# Patient Record
Sex: Female | Born: 1945
Health system: Southern US, Community
[De-identification: ages and names within clinical notes are randomized; demographics above are authoritative.]

## PROBLEM LIST (undated history)

## (undated) DIAGNOSIS — R000 Tachycardia, unspecified: Secondary | ICD-10-CM

## (undated) DIAGNOSIS — E7800 Pure hypercholesterolemia, unspecified: Secondary | ICD-10-CM

## (undated) DIAGNOSIS — I10 Essential (primary) hypertension: Secondary | ICD-10-CM

## (undated) HISTORY — PX: TRIGGER FINGER RELEASE: SHX641

## (undated) HISTORY — PX: CATARACT EXTRACTION: SUR2

## (undated) HISTORY — PX: CHOLECYSTECTOMY: SHX55

## (undated) HISTORY — PX: APPENDECTOMY: SHX54

---

## 2005-02-20 ENCOUNTER — Other Ambulatory Visit: Admission: RE | Admit: 2005-02-20 | Discharge: 2005-02-20 | Payer: Self-pay | Admitting: Internal Medicine

## 2005-02-21 ENCOUNTER — Encounter: Admission: RE | Admit: 2005-02-21 | Discharge: 2005-02-21 | Payer: Self-pay | Admitting: Internal Medicine

## 2006-02-25 ENCOUNTER — Encounter: Admission: RE | Admit: 2006-02-25 | Discharge: 2006-02-25 | Payer: Self-pay | Admitting: Internal Medicine

## 2006-03-11 ENCOUNTER — Encounter: Admission: RE | Admit: 2006-03-11 | Discharge: 2006-03-11 | Payer: Self-pay | Admitting: Internal Medicine

## 2007-02-28 ENCOUNTER — Encounter: Admission: RE | Admit: 2007-02-28 | Discharge: 2007-02-28 | Payer: Self-pay | Admitting: Family Medicine

## 2007-05-21 ENCOUNTER — Other Ambulatory Visit: Admission: RE | Admit: 2007-05-21 | Discharge: 2007-05-21 | Payer: Self-pay | Admitting: Family Medicine

## 2008-04-06 ENCOUNTER — Encounter: Admission: RE | Admit: 2008-04-06 | Discharge: 2008-04-06 | Payer: Self-pay | Admitting: Family Medicine

## 2009-04-21 ENCOUNTER — Encounter: Admission: RE | Admit: 2009-04-21 | Discharge: 2009-04-21 | Payer: Self-pay | Admitting: Family Medicine

## 2010-04-25 ENCOUNTER — Encounter: Admission: RE | Admit: 2010-04-25 | Discharge: 2010-04-25 | Payer: Self-pay | Admitting: Family Medicine

## 2011-04-02 ENCOUNTER — Other Ambulatory Visit: Payer: Self-pay | Admitting: Family Medicine

## 2011-04-02 DIAGNOSIS — Z1231 Encounter for screening mammogram for malignant neoplasm of breast: Secondary | ICD-10-CM

## 2011-05-03 ENCOUNTER — Ambulatory Visit
Admission: RE | Admit: 2011-05-03 | Discharge: 2011-05-03 | Disposition: A | Discharge status: Home / Self Care | Payer: BC Managed Care – PPO | Source: Ambulatory Visit | Attending: Family Medicine | Admitting: Family Medicine

## 2011-05-03 DIAGNOSIS — Z1231 Encounter for screening mammogram for malignant neoplasm of breast: Secondary | ICD-10-CM

## 2012-04-03 ENCOUNTER — Other Ambulatory Visit: Payer: Self-pay | Admitting: Family Medicine

## 2012-04-03 DIAGNOSIS — Z1231 Encounter for screening mammogram for malignant neoplasm of breast: Secondary | ICD-10-CM

## 2012-05-05 ENCOUNTER — Ambulatory Visit: Payer: BC Managed Care – PPO

## 2012-05-20 ENCOUNTER — Other Ambulatory Visit: Payer: Self-pay | Admitting: Family Medicine

## 2012-05-20 ENCOUNTER — Ambulatory Visit
Admission: RE | Admit: 2012-05-20 | Discharge: 2012-05-20 | Disposition: A | Discharge status: Home / Self Care | Payer: BC Managed Care – PPO | Source: Ambulatory Visit | Attending: Family Medicine | Admitting: Family Medicine

## 2012-05-20 DIAGNOSIS — Z1231 Encounter for screening mammogram for malignant neoplasm of breast: Secondary | ICD-10-CM

## 2012-05-23 ENCOUNTER — Ambulatory Visit: Payer: BC Managed Care – PPO

## 2012-05-28 ENCOUNTER — Ambulatory Visit
Admission: RE | Admit: 2012-05-28 | Discharge: 2012-05-28 | Disposition: A | Discharge status: Home / Self Care | Payer: BC Managed Care – PPO | Source: Ambulatory Visit | Attending: Family Medicine | Admitting: Family Medicine

## 2012-05-28 DIAGNOSIS — Z1231 Encounter for screening mammogram for malignant neoplasm of breast: Secondary | ICD-10-CM

## 2013-04-28 ENCOUNTER — Other Ambulatory Visit: Payer: Self-pay

## 2013-04-28 DIAGNOSIS — Z1231 Encounter for screening mammogram for malignant neoplasm of breast: Secondary | ICD-10-CM

## 2013-05-29 ENCOUNTER — Ambulatory Visit: Payer: BC Managed Care – PPO

## 2013-06-05 ENCOUNTER — Ambulatory Visit
Admission: RE | Admit: 2013-06-05 | Discharge: 2013-06-05 | Disposition: A | Discharge status: Home / Self Care | Payer: Medicare HMO | Source: Ambulatory Visit

## 2013-06-05 DIAGNOSIS — Z1231 Encounter for screening mammogram for malignant neoplasm of breast: Secondary | ICD-10-CM

## 2013-12-14 ENCOUNTER — Other Ambulatory Visit (HOSPITAL_COMMUNITY): Payer: Self-pay | Admitting: Family Medicine

## 2013-12-14 ENCOUNTER — Ambulatory Visit (HOSPITAL_COMMUNITY)
Admission: RE | Admit: 2013-12-14 | Discharge: 2013-12-14 | Disposition: A | Discharge status: Home / Self Care | Payer: Medicare HMO | Source: Ambulatory Visit | Attending: Family Medicine | Admitting: Family Medicine

## 2013-12-14 DIAGNOSIS — M7989 Other specified soft tissue disorders: Secondary | ICD-10-CM

## 2013-12-14 DIAGNOSIS — M79604 Pain in right leg: Secondary | ICD-10-CM

## 2013-12-14 DIAGNOSIS — M79609 Pain in unspecified limb: Secondary | ICD-10-CM

## 2013-12-14 NOTE — Progress Notes (Signed)
VASCULAR LAB PRELIMINARY  PRELIMINARY  PRELIMINARY  PRELIMINARY  Right lower extremity venous Doppler completed.    Preliminary report:  There is no DVT or SVT noted in the right lower extremity.  Denys Salinger, RVT 12/14/2013, 3:00 PM

## 2014-05-03 ENCOUNTER — Other Ambulatory Visit: Payer: Self-pay

## 2014-05-03 DIAGNOSIS — Z1231 Encounter for screening mammogram for malignant neoplasm of breast: Secondary | ICD-10-CM

## 2014-06-07 ENCOUNTER — Ambulatory Visit: Payer: Medicare HMO

## 2014-06-08 ENCOUNTER — Ambulatory Visit
Admission: RE | Admit: 2014-06-08 | Discharge: 2014-06-08 | Disposition: A | Discharge status: Home / Self Care | Payer: Medicare HMO | Source: Ambulatory Visit

## 2014-06-08 DIAGNOSIS — Z1231 Encounter for screening mammogram for malignant neoplasm of breast: Secondary | ICD-10-CM

## 2014-12-14 DIAGNOSIS — R002 Palpitations: Secondary | ICD-10-CM | POA: Diagnosis not present

## 2014-12-14 DIAGNOSIS — E049 Nontoxic goiter, unspecified: Secondary | ICD-10-CM | POA: Diagnosis not present

## 2014-12-14 DIAGNOSIS — I10 Essential (primary) hypertension: Secondary | ICD-10-CM | POA: Diagnosis not present

## 2014-12-28 DIAGNOSIS — E663 Overweight: Secondary | ICD-10-CM | POA: Diagnosis not present

## 2014-12-28 DIAGNOSIS — R42 Dizziness and giddiness: Secondary | ICD-10-CM | POA: Diagnosis not present

## 2014-12-28 DIAGNOSIS — R002 Palpitations: Secondary | ICD-10-CM | POA: Diagnosis not present

## 2014-12-28 DIAGNOSIS — I10 Essential (primary) hypertension: Secondary | ICD-10-CM | POA: Diagnosis not present

## 2014-12-29 DIAGNOSIS — R42 Dizziness and giddiness: Secondary | ICD-10-CM | POA: Diagnosis not present

## 2014-12-29 DIAGNOSIS — I10 Essential (primary) hypertension: Secondary | ICD-10-CM | POA: Diagnosis not present

## 2014-12-29 DIAGNOSIS — E663 Overweight: Secondary | ICD-10-CM | POA: Diagnosis not present

## 2014-12-29 DIAGNOSIS — I471 Supraventricular tachycardia: Secondary | ICD-10-CM | POA: Diagnosis not present

## 2014-12-29 DIAGNOSIS — R002 Palpitations: Secondary | ICD-10-CM | POA: Diagnosis not present

## 2015-01-03 DIAGNOSIS — I471 Supraventricular tachycardia: Secondary | ICD-10-CM | POA: Diagnosis not present

## 2015-01-05 DIAGNOSIS — R002 Palpitations: Secondary | ICD-10-CM | POA: Diagnosis not present

## 2015-01-31 DIAGNOSIS — E663 Overweight: Secondary | ICD-10-CM | POA: Diagnosis not present

## 2015-01-31 DIAGNOSIS — I10 Essential (primary) hypertension: Secondary | ICD-10-CM | POA: Diagnosis not present

## 2015-01-31 DIAGNOSIS — I471 Supraventricular tachycardia: Secondary | ICD-10-CM | POA: Diagnosis not present

## 2015-01-31 DIAGNOSIS — R002 Palpitations: Secondary | ICD-10-CM | POA: Diagnosis not present

## 2015-01-31 DIAGNOSIS — R42 Dizziness and giddiness: Secondary | ICD-10-CM | POA: Diagnosis not present

## 2015-02-16 DIAGNOSIS — H524 Presbyopia: Secondary | ICD-10-CM | POA: Diagnosis not present

## 2015-02-16 DIAGNOSIS — H2513 Age-related nuclear cataract, bilateral: Secondary | ICD-10-CM | POA: Diagnosis not present

## 2015-02-16 DIAGNOSIS — H35033 Hypertensive retinopathy, bilateral: Secondary | ICD-10-CM | POA: Diagnosis not present

## 2015-03-22 DIAGNOSIS — I10 Essential (primary) hypertension: Secondary | ICD-10-CM | POA: Diagnosis not present

## 2015-03-22 DIAGNOSIS — E6609 Other obesity due to excess calories: Secondary | ICD-10-CM | POA: Diagnosis not present

## 2015-03-22 DIAGNOSIS — I471 Supraventricular tachycardia: Secondary | ICD-10-CM | POA: Diagnosis not present

## 2015-05-02 ENCOUNTER — Other Ambulatory Visit: Payer: Self-pay

## 2015-05-02 DIAGNOSIS — Z1231 Encounter for screening mammogram for malignant neoplasm of breast: Secondary | ICD-10-CM

## 2015-06-10 ENCOUNTER — Ambulatory Visit
Admission: RE | Admit: 2015-06-10 | Discharge: 2015-06-10 | Disposition: A | Discharge status: Home / Self Care | Payer: Commercial Managed Care - HMO | Source: Ambulatory Visit

## 2015-06-10 DIAGNOSIS — Z1231 Encounter for screening mammogram for malignant neoplasm of breast: Secondary | ICD-10-CM

## 2015-09-20 DIAGNOSIS — E049 Nontoxic goiter, unspecified: Secondary | ICD-10-CM | POA: Diagnosis not present

## 2015-09-20 DIAGNOSIS — M8580 Other specified disorders of bone density and structure, unspecified site: Secondary | ICD-10-CM | POA: Diagnosis not present

## 2015-09-20 DIAGNOSIS — I10 Essential (primary) hypertension: Secondary | ICD-10-CM | POA: Diagnosis not present

## 2015-09-20 DIAGNOSIS — R7309 Other abnormal glucose: Secondary | ICD-10-CM | POA: Diagnosis not present

## 2015-09-20 DIAGNOSIS — H9190 Unspecified hearing loss, unspecified ear: Secondary | ICD-10-CM | POA: Diagnosis not present

## 2015-09-20 DIAGNOSIS — E6609 Other obesity due to excess calories: Secondary | ICD-10-CM | POA: Diagnosis not present

## 2015-09-20 DIAGNOSIS — I471 Supraventricular tachycardia: Secondary | ICD-10-CM | POA: Diagnosis not present

## 2015-12-26 DIAGNOSIS — J069 Acute upper respiratory infection, unspecified: Secondary | ICD-10-CM | POA: Diagnosis not present

## 2015-12-26 DIAGNOSIS — J040 Acute laryngitis: Secondary | ICD-10-CM | POA: Diagnosis not present

## 2016-03-01 DIAGNOSIS — H04123 Dry eye syndrome of bilateral lacrimal glands: Secondary | ICD-10-CM | POA: Diagnosis not present

## 2016-03-01 DIAGNOSIS — H524 Presbyopia: Secondary | ICD-10-CM | POA: Diagnosis not present

## 2016-03-01 DIAGNOSIS — H35033 Hypertensive retinopathy, bilateral: Secondary | ICD-10-CM | POA: Diagnosis not present

## 2016-03-01 DIAGNOSIS — H2513 Age-related nuclear cataract, bilateral: Secondary | ICD-10-CM | POA: Diagnosis not present

## 2016-03-01 DIAGNOSIS — H5203 Hypermetropia, bilateral: Secondary | ICD-10-CM | POA: Diagnosis not present

## 2016-03-23 DIAGNOSIS — I10 Essential (primary) hypertension: Secondary | ICD-10-CM | POA: Diagnosis not present

## 2016-03-23 DIAGNOSIS — H9190 Unspecified hearing loss, unspecified ear: Secondary | ICD-10-CM | POA: Diagnosis not present

## 2016-03-23 DIAGNOSIS — R002 Palpitations: Secondary | ICD-10-CM | POA: Diagnosis not present

## 2016-03-23 DIAGNOSIS — I471 Supraventricular tachycardia: Secondary | ICD-10-CM | POA: Diagnosis not present

## 2016-03-23 DIAGNOSIS — M8580 Other specified disorders of bone density and structure, unspecified site: Secondary | ICD-10-CM | POA: Diagnosis not present

## 2016-03-23 DIAGNOSIS — E7800 Pure hypercholesterolemia, unspecified: Secondary | ICD-10-CM | POA: Diagnosis not present

## 2016-03-23 DIAGNOSIS — Z209 Contact with and (suspected) exposure to unspecified communicable disease: Secondary | ICD-10-CM | POA: Diagnosis not present

## 2016-03-23 DIAGNOSIS — E049 Nontoxic goiter, unspecified: Secondary | ICD-10-CM | POA: Diagnosis not present

## 2016-03-23 DIAGNOSIS — Z0000 Encounter for general adult medical examination without abnormal findings: Secondary | ICD-10-CM | POA: Diagnosis not present

## 2016-05-08 ENCOUNTER — Other Ambulatory Visit: Payer: Self-pay | Admitting: Family Medicine

## 2016-05-08 DIAGNOSIS — Z1231 Encounter for screening mammogram for malignant neoplasm of breast: Secondary | ICD-10-CM

## 2016-06-11 ENCOUNTER — Ambulatory Visit
Admission: RE | Admit: 2016-06-11 | Discharge: 2016-06-11 | Disposition: A | Discharge status: Home / Self Care | Payer: Commercial Managed Care - HMO | Source: Ambulatory Visit | Attending: Family Medicine | Admitting: Family Medicine

## 2016-06-11 DIAGNOSIS — Z1231 Encounter for screening mammogram for malignant neoplasm of breast: Secondary | ICD-10-CM

## 2016-09-24 DIAGNOSIS — Z6829 Body mass index (BMI) 29.0-29.9, adult: Secondary | ICD-10-CM | POA: Diagnosis not present

## 2016-09-24 DIAGNOSIS — E7800 Pure hypercholesterolemia, unspecified: Secondary | ICD-10-CM | POA: Diagnosis not present

## 2016-09-24 DIAGNOSIS — E663 Overweight: Secondary | ICD-10-CM | POA: Diagnosis not present

## 2016-09-24 DIAGNOSIS — I10 Essential (primary) hypertension: Secondary | ICD-10-CM | POA: Diagnosis not present

## 2017-03-07 DIAGNOSIS — H04123 Dry eye syndrome of bilateral lacrimal glands: Secondary | ICD-10-CM | POA: Diagnosis not present

## 2017-03-07 DIAGNOSIS — H2513 Age-related nuclear cataract, bilateral: Secondary | ICD-10-CM | POA: Diagnosis not present

## 2017-03-07 DIAGNOSIS — H35033 Hypertensive retinopathy, bilateral: Secondary | ICD-10-CM | POA: Diagnosis not present

## 2017-03-07 DIAGNOSIS — H524 Presbyopia: Secondary | ICD-10-CM | POA: Diagnosis not present

## 2017-03-07 DIAGNOSIS — H5203 Hypermetropia, bilateral: Secondary | ICD-10-CM | POA: Diagnosis not present

## 2017-03-26 DIAGNOSIS — I471 Supraventricular tachycardia: Secondary | ICD-10-CM | POA: Diagnosis not present

## 2017-03-26 DIAGNOSIS — I10 Essential (primary) hypertension: Secondary | ICD-10-CM | POA: Diagnosis not present

## 2017-03-26 DIAGNOSIS — H9190 Unspecified hearing loss, unspecified ear: Secondary | ICD-10-CM | POA: Diagnosis not present

## 2017-03-26 DIAGNOSIS — E663 Overweight: Secondary | ICD-10-CM | POA: Diagnosis not present

## 2017-03-26 DIAGNOSIS — E049 Nontoxic goiter, unspecified: Secondary | ICD-10-CM | POA: Diagnosis not present

## 2017-03-26 DIAGNOSIS — Z6829 Body mass index (BMI) 29.0-29.9, adult: Secondary | ICD-10-CM | POA: Diagnosis not present

## 2017-03-26 DIAGNOSIS — M8580 Other specified disorders of bone density and structure, unspecified site: Secondary | ICD-10-CM | POA: Diagnosis not present

## 2017-03-26 DIAGNOSIS — E7800 Pure hypercholesterolemia, unspecified: Secondary | ICD-10-CM | POA: Diagnosis not present

## 2017-03-26 DIAGNOSIS — M859 Disorder of bone density and structure, unspecified: Secondary | ICD-10-CM | POA: Diagnosis not present

## 2017-03-26 DIAGNOSIS — Z0000 Encounter for general adult medical examination without abnormal findings: Secondary | ICD-10-CM | POA: Diagnosis not present

## 2017-05-03 ENCOUNTER — Other Ambulatory Visit: Payer: Self-pay | Admitting: Family Medicine

## 2017-05-03 DIAGNOSIS — Z1231 Encounter for screening mammogram for malignant neoplasm of breast: Secondary | ICD-10-CM

## 2017-06-13 ENCOUNTER — Ambulatory Visit
Admission: RE | Admit: 2017-06-13 | Discharge: 2017-06-13 | Disposition: A | Discharge status: Home / Self Care | Payer: Commercial Managed Care - HMO | Source: Ambulatory Visit | Attending: Family Medicine | Admitting: Family Medicine

## 2017-06-13 DIAGNOSIS — Z1231 Encounter for screening mammogram for malignant neoplasm of breast: Secondary | ICD-10-CM | POA: Diagnosis not present

## 2017-06-17 ENCOUNTER — Other Ambulatory Visit: Payer: Self-pay | Admitting: Family Medicine

## 2017-06-17 DIAGNOSIS — R928 Other abnormal and inconclusive findings on diagnostic imaging of breast: Secondary | ICD-10-CM

## 2017-06-19 ENCOUNTER — Ambulatory Visit
Admission: RE | Admit: 2017-06-19 | Discharge: 2017-06-19 | Disposition: A | Discharge status: Home / Self Care | Payer: Commercial Managed Care - HMO | Source: Ambulatory Visit | Attending: Family Medicine | Admitting: Family Medicine

## 2017-06-19 ENCOUNTER — Ambulatory Visit: Payer: Commercial Managed Care - HMO

## 2017-06-19 DIAGNOSIS — R928 Other abnormal and inconclusive findings on diagnostic imaging of breast: Secondary | ICD-10-CM

## 2017-06-26 DIAGNOSIS — D485 Neoplasm of uncertain behavior of skin: Secondary | ICD-10-CM | POA: Diagnosis not present

## 2017-06-26 DIAGNOSIS — L821 Other seborrheic keratosis: Secondary | ICD-10-CM | POA: Diagnosis not present

## 2018-04-08 DIAGNOSIS — E7800 Pure hypercholesterolemia, unspecified: Secondary | ICD-10-CM | POA: Diagnosis not present

## 2018-04-08 DIAGNOSIS — E049 Nontoxic goiter, unspecified: Secondary | ICD-10-CM | POA: Diagnosis not present

## 2018-04-08 DIAGNOSIS — H9190 Unspecified hearing loss, unspecified ear: Secondary | ICD-10-CM | POA: Diagnosis not present

## 2018-04-08 DIAGNOSIS — Z1211 Encounter for screening for malignant neoplasm of colon: Secondary | ICD-10-CM | POA: Diagnosis not present

## 2018-04-08 DIAGNOSIS — I10 Essential (primary) hypertension: Secondary | ICD-10-CM | POA: Diagnosis not present

## 2018-04-08 DIAGNOSIS — M859 Disorder of bone density and structure, unspecified: Secondary | ICD-10-CM | POA: Diagnosis not present

## 2018-04-08 DIAGNOSIS — Z0000 Encounter for general adult medical examination without abnormal findings: Secondary | ICD-10-CM | POA: Diagnosis not present

## 2018-04-08 DIAGNOSIS — E663 Overweight: Secondary | ICD-10-CM | POA: Diagnosis not present

## 2018-04-08 DIAGNOSIS — Z6829 Body mass index (BMI) 29.0-29.9, adult: Secondary | ICD-10-CM | POA: Diagnosis not present

## 2018-05-12 ENCOUNTER — Other Ambulatory Visit: Payer: Self-pay | Admitting: Family Medicine

## 2018-05-12 DIAGNOSIS — Z6829 Body mass index (BMI) 29.0-29.9, adult: Secondary | ICD-10-CM | POA: Diagnosis not present

## 2018-05-12 DIAGNOSIS — Z1231 Encounter for screening mammogram for malignant neoplasm of breast: Secondary | ICD-10-CM

## 2018-05-12 DIAGNOSIS — L0390 Cellulitis, unspecified: Secondary | ICD-10-CM | POA: Diagnosis not present

## 2018-05-12 DIAGNOSIS — L259 Unspecified contact dermatitis, unspecified cause: Secondary | ICD-10-CM | POA: Diagnosis not present

## 2018-06-11 DIAGNOSIS — M8588 Other specified disorders of bone density and structure, other site: Secondary | ICD-10-CM | POA: Diagnosis not present

## 2018-06-19 DIAGNOSIS — H2513 Age-related nuclear cataract, bilateral: Secondary | ICD-10-CM | POA: Diagnosis not present

## 2018-06-19 DIAGNOSIS — H35033 Hypertensive retinopathy, bilateral: Secondary | ICD-10-CM | POA: Diagnosis not present

## 2018-06-19 DIAGNOSIS — H04123 Dry eye syndrome of bilateral lacrimal glands: Secondary | ICD-10-CM | POA: Diagnosis not present

## 2018-06-30 ENCOUNTER — Ambulatory Visit
Admission: RE | Admit: 2018-06-30 | Discharge: 2018-06-30 | Disposition: A | Discharge status: Home / Self Care | Payer: Medicare HMO | Source: Ambulatory Visit | Attending: Family Medicine | Admitting: Family Medicine

## 2018-06-30 DIAGNOSIS — Z1231 Encounter for screening mammogram for malignant neoplasm of breast: Secondary | ICD-10-CM

## 2018-07-29 DIAGNOSIS — M8580 Other specified disorders of bone density and structure, unspecified site: Secondary | ICD-10-CM | POA: Diagnosis not present

## 2018-07-29 DIAGNOSIS — Z6829 Body mass index (BMI) 29.0-29.9, adult: Secondary | ICD-10-CM | POA: Diagnosis not present

## 2018-10-20 IMAGING — MG 2D DIGITAL DIAGNOSTIC UNILATERAL RIGHT MAMMOGRAM WITH CAD AND AD
6 of 9 series · 6 of 21 positions shown · non-contrast
Comparison: Previous exam(s).

CLINICAL DATA: Possible mass in the upper-outer quadrant of the
right breast on a recent screening mammogram.

EXAM:
2D DIGITAL DIAGNOSTIC UNILATERAL RIGHT MAMMOGRAM WITH CAD AND
ADJUNCT TOMO

[R MLO]
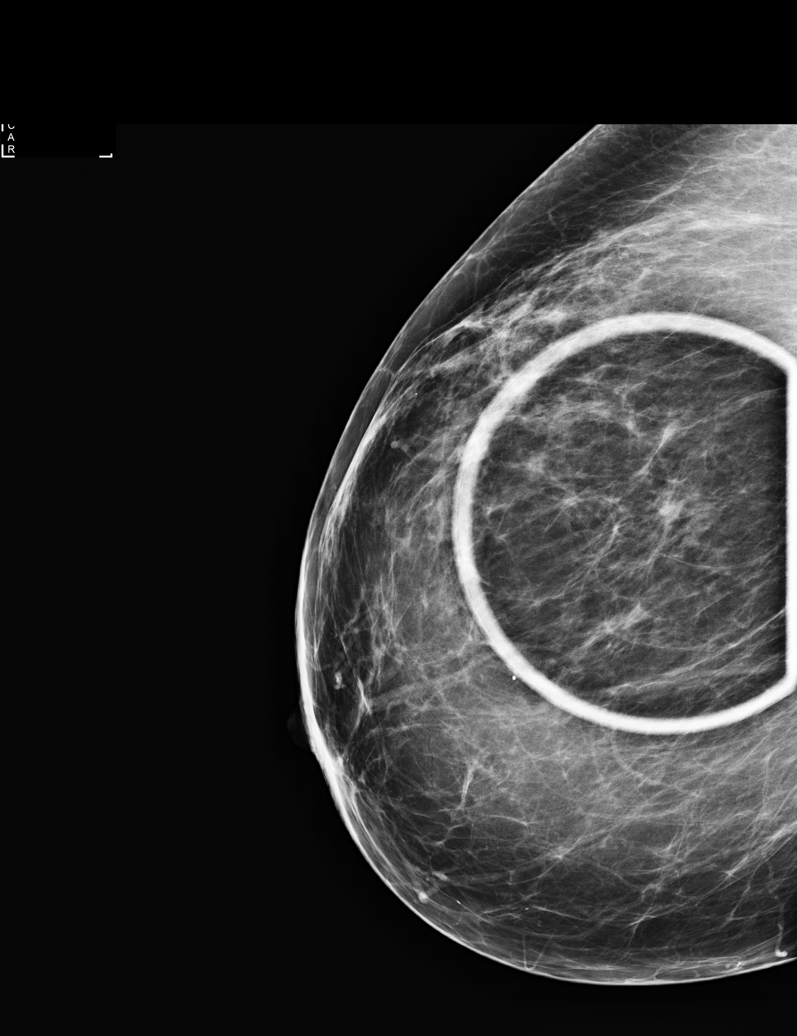

[R ML synth-2D]
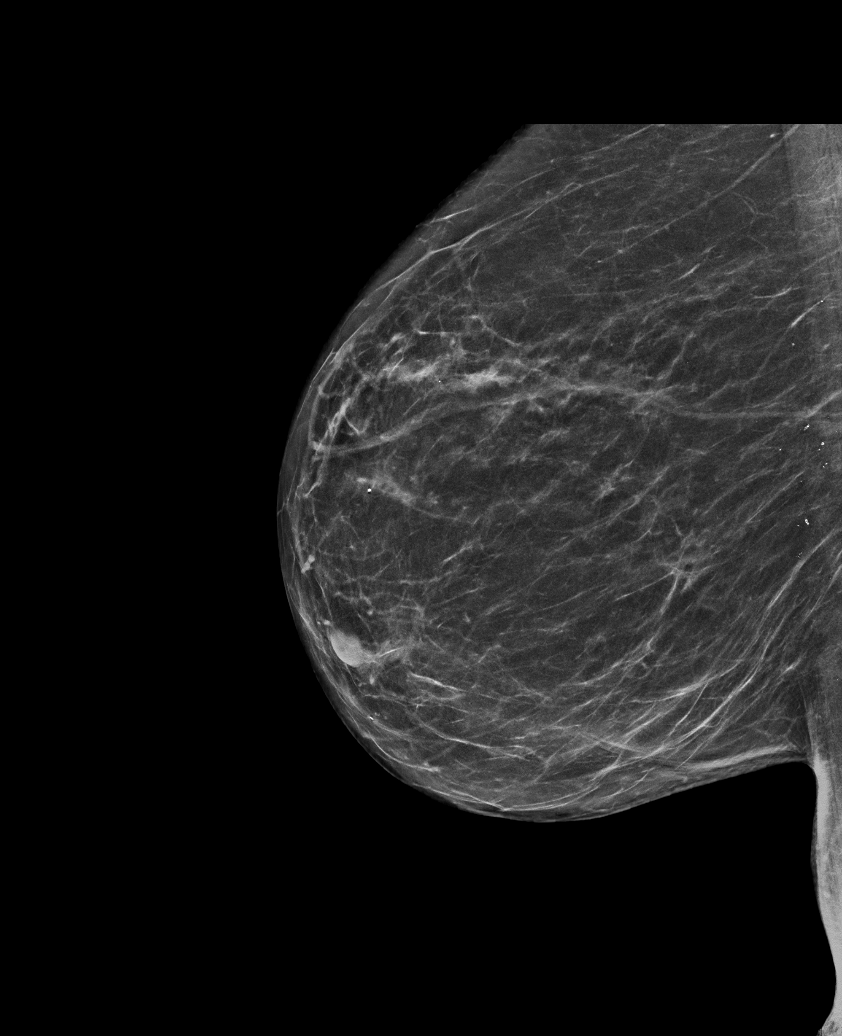

[R CC]
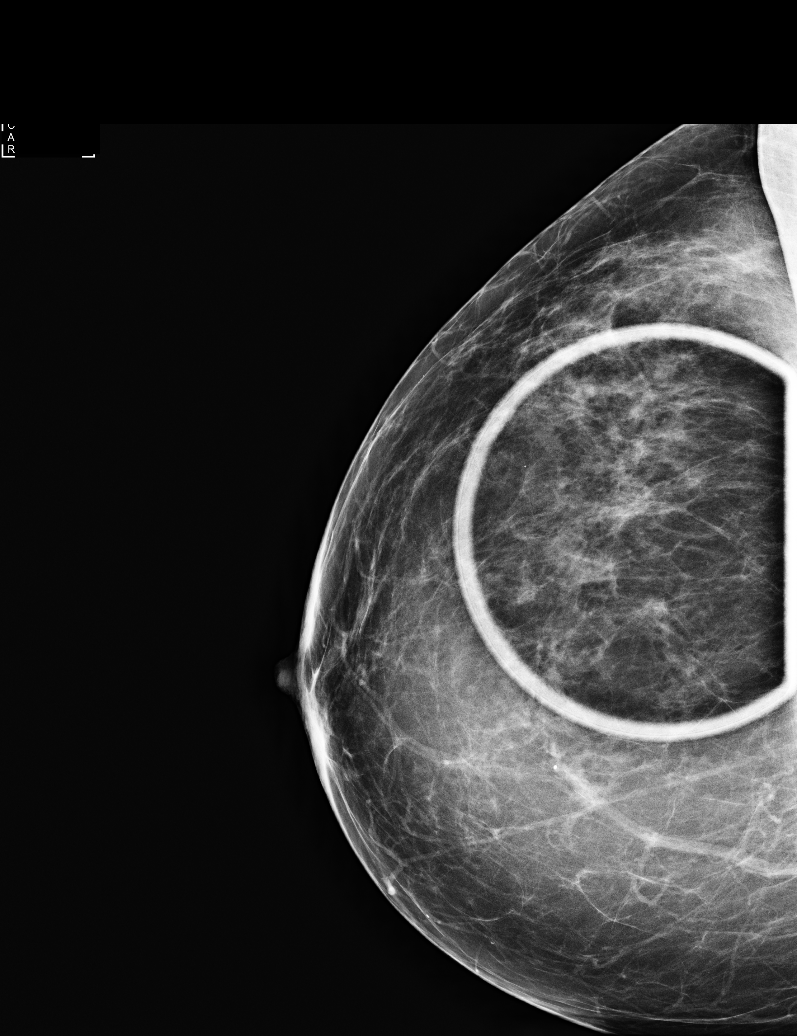

[R ML]
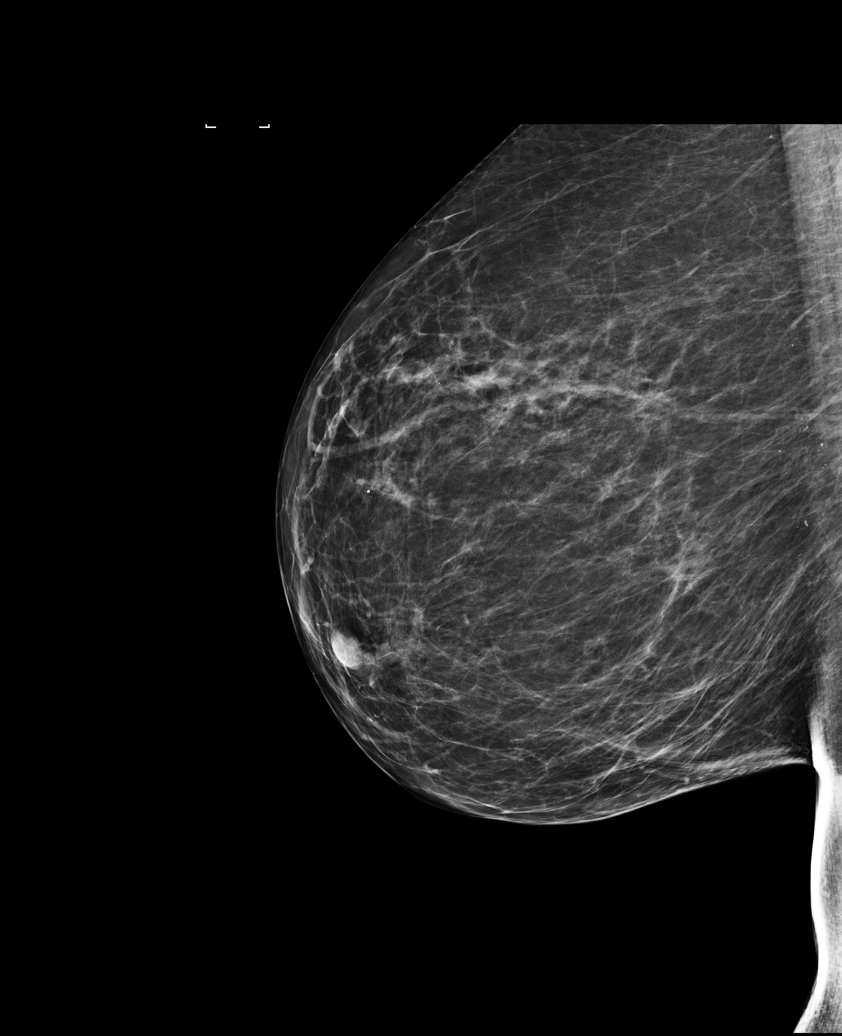

[R CC synth-2D]
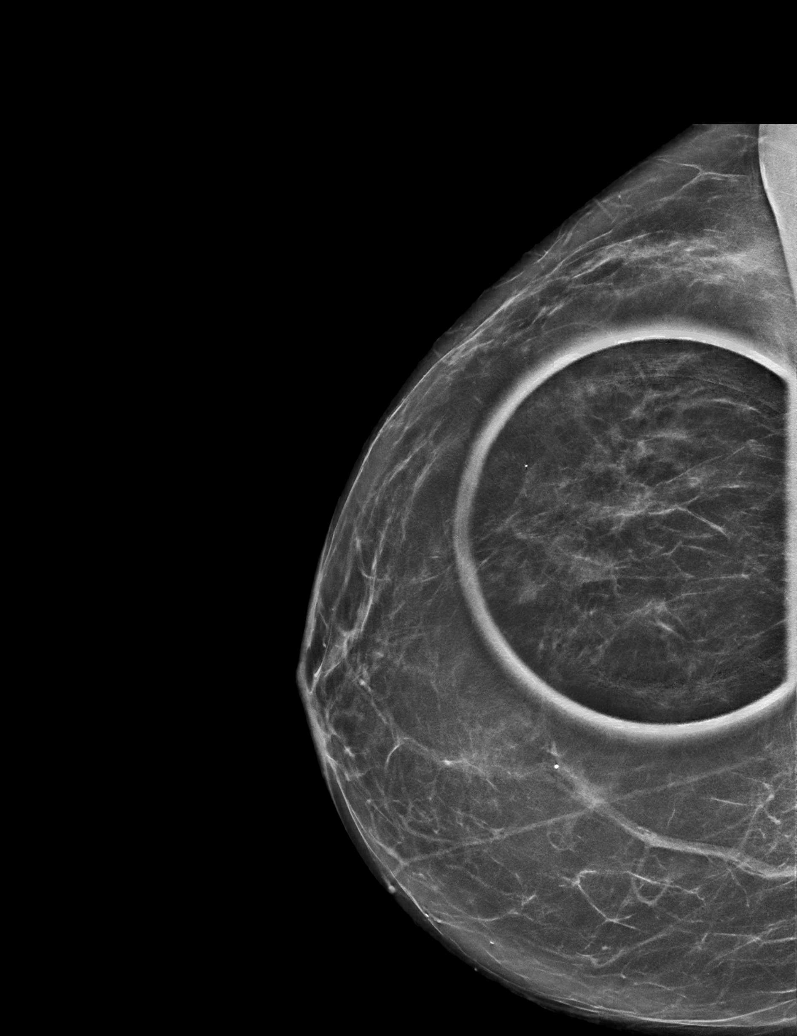

[R MLO synth-2D]
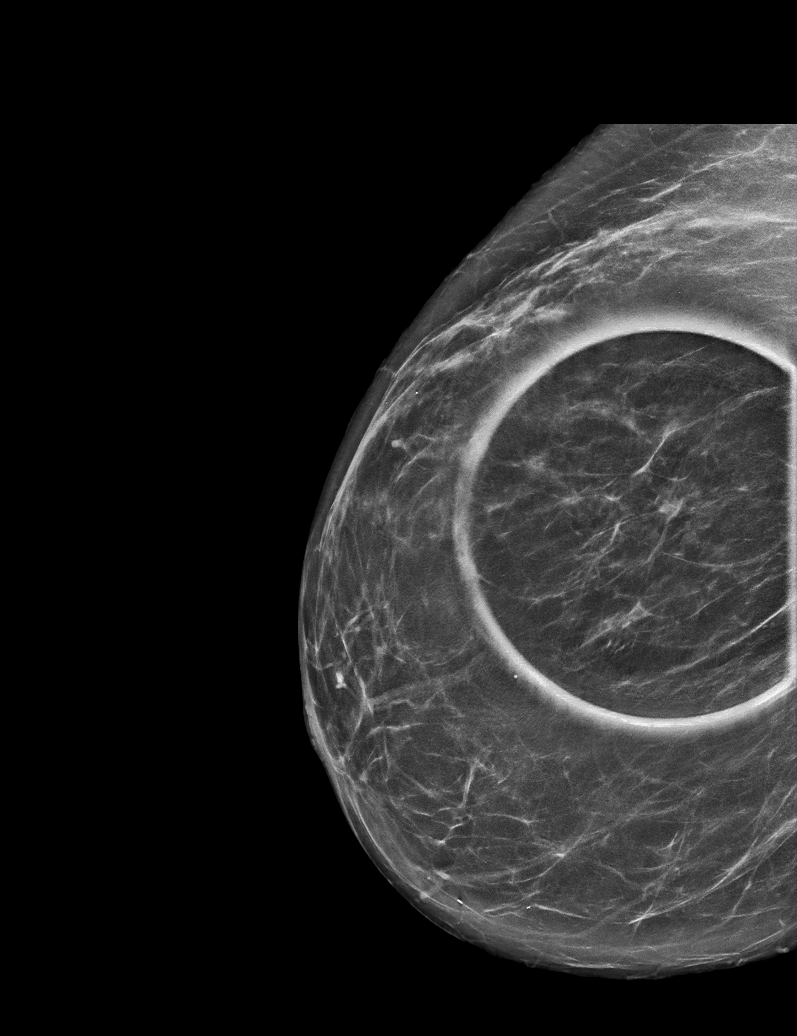

[6 of 21 positions shown; findings below may reference images not displayed]

ACR Breast Density Category b: There are scattered areas of
fibroglandular density.
FINDINGS: 2D and 3D tomographic true lateral and spot compression views of the
right breast were obtained. These demonstrate normal appearing
breast tissue at the location of the recently suspected mass.

Mammographic images were processed with CAD.
IMPRESSION: No evidence of malignancy. The recently suspected right breast mass
was close apposition of normal breast tissue.

RECOMMENDATION:
Bilateral screening mammogram in 1 year.

I have discussed the findings and recommendations with the patient.
Results were also provided in writing at the conclusion of the
visit. If applicable, a reminder letter will be sent to the patient
regarding the next appointment.

BI-RADS CATEGORY  1: Negative.

## 2018-12-25 DIAGNOSIS — H04123 Dry eye syndrome of bilateral lacrimal glands: Secondary | ICD-10-CM | POA: Diagnosis not present

## 2018-12-25 DIAGNOSIS — H25813 Combined forms of age-related cataract, bilateral: Secondary | ICD-10-CM | POA: Diagnosis not present

## 2018-12-25 DIAGNOSIS — H35033 Hypertensive retinopathy, bilateral: Secondary | ICD-10-CM | POA: Diagnosis not present

## 2019-03-30 DIAGNOSIS — H2513 Age-related nuclear cataract, bilateral: Secondary | ICD-10-CM | POA: Diagnosis not present

## 2019-03-30 DIAGNOSIS — H2512 Age-related nuclear cataract, left eye: Secondary | ICD-10-CM | POA: Diagnosis not present

## 2019-04-20 DIAGNOSIS — Z961 Presence of intraocular lens: Secondary | ICD-10-CM | POA: Diagnosis not present

## 2019-04-20 DIAGNOSIS — H2512 Age-related nuclear cataract, left eye: Secondary | ICD-10-CM | POA: Diagnosis not present

## 2019-04-20 DIAGNOSIS — H25812 Combined forms of age-related cataract, left eye: Secondary | ICD-10-CM | POA: Diagnosis not present

## 2019-04-28 DIAGNOSIS — H2511 Age-related nuclear cataract, right eye: Secondary | ICD-10-CM | POA: Diagnosis not present

## 2019-05-04 DIAGNOSIS — E7800 Pure hypercholesterolemia, unspecified: Secondary | ICD-10-CM | POA: Diagnosis not present

## 2019-05-04 DIAGNOSIS — Z0000 Encounter for general adult medical examination without abnormal findings: Secondary | ICD-10-CM | POA: Diagnosis not present

## 2019-05-04 DIAGNOSIS — E049 Nontoxic goiter, unspecified: Secondary | ICD-10-CM | POA: Diagnosis not present

## 2019-05-04 DIAGNOSIS — I10 Essential (primary) hypertension: Secondary | ICD-10-CM | POA: Diagnosis not present

## 2019-05-04 DIAGNOSIS — H9190 Unspecified hearing loss, unspecified ear: Secondary | ICD-10-CM | POA: Diagnosis not present

## 2019-05-04 DIAGNOSIS — M8580 Other specified disorders of bone density and structure, unspecified site: Secondary | ICD-10-CM | POA: Diagnosis not present

## 2019-05-11 DIAGNOSIS — H25811 Combined forms of age-related cataract, right eye: Secondary | ICD-10-CM | POA: Diagnosis not present

## 2019-05-11 DIAGNOSIS — H2511 Age-related nuclear cataract, right eye: Secondary | ICD-10-CM | POA: Diagnosis not present

## 2019-05-14 DIAGNOSIS — E7800 Pure hypercholesterolemia, unspecified: Secondary | ICD-10-CM | POA: Diagnosis not present

## 2019-05-14 DIAGNOSIS — Z1211 Encounter for screening for malignant neoplasm of colon: Secondary | ICD-10-CM | POA: Diagnosis not present

## 2019-05-14 DIAGNOSIS — M8580 Other specified disorders of bone density and structure, unspecified site: Secondary | ICD-10-CM | POA: Diagnosis not present

## 2019-05-14 DIAGNOSIS — I10 Essential (primary) hypertension: Secondary | ICD-10-CM | POA: Diagnosis not present

## 2019-05-14 DIAGNOSIS — Z0000 Encounter for general adult medical examination without abnormal findings: Secondary | ICD-10-CM | POA: Diagnosis not present

## 2019-05-14 DIAGNOSIS — E049 Nontoxic goiter, unspecified: Secondary | ICD-10-CM | POA: Diagnosis not present

## 2019-05-14 DIAGNOSIS — H9190 Unspecified hearing loss, unspecified ear: Secondary | ICD-10-CM | POA: Diagnosis not present

## 2019-05-21 ENCOUNTER — Other Ambulatory Visit: Payer: Self-pay | Admitting: Family Medicine

## 2019-05-21 DIAGNOSIS — Z1231 Encounter for screening mammogram for malignant neoplasm of breast: Secondary | ICD-10-CM

## 2019-07-07 ENCOUNTER — Ambulatory Visit: Payer: Medicare HMO

## 2019-07-30 ENCOUNTER — Ambulatory Visit: Payer: Medicare HMO

## 2019-09-10 ENCOUNTER — Ambulatory Visit
Admission: RE | Admit: 2019-09-10 | Discharge: 2019-09-10 | Disposition: A | Discharge status: Home / Self Care | Payer: Medicare HMO | Source: Ambulatory Visit | Attending: Family Medicine | Admitting: Family Medicine

## 2019-09-10 ENCOUNTER — Other Ambulatory Visit: Payer: Self-pay

## 2019-09-10 DIAGNOSIS — Z1231 Encounter for screening mammogram for malignant neoplasm of breast: Secondary | ICD-10-CM | POA: Diagnosis not present

## 2019-09-30 DIAGNOSIS — M720 Palmar fascial fibromatosis [Dupuytren]: Secondary | ICD-10-CM | POA: Diagnosis not present

## 2019-09-30 DIAGNOSIS — Z6830 Body mass index (BMI) 30.0-30.9, adult: Secondary | ICD-10-CM | POA: Diagnosis not present

## 2019-10-26 DIAGNOSIS — M65341 Trigger finger, right ring finger: Secondary | ICD-10-CM | POA: Diagnosis not present

## 2019-10-26 DIAGNOSIS — M24541 Contracture, right hand: Secondary | ICD-10-CM | POA: Diagnosis not present

## 2019-10-26 DIAGNOSIS — M720 Palmar fascial fibromatosis [Dupuytren]: Secondary | ICD-10-CM | POA: Diagnosis not present

## 2019-11-27 DIAGNOSIS — M24541 Contracture, right hand: Secondary | ICD-10-CM | POA: Diagnosis not present

## 2019-11-27 DIAGNOSIS — M720 Palmar fascial fibromatosis [Dupuytren]: Secondary | ICD-10-CM | POA: Diagnosis not present

## 2019-11-27 DIAGNOSIS — M65341 Trigger finger, right ring finger: Secondary | ICD-10-CM | POA: Diagnosis not present

## 2019-12-01 DIAGNOSIS — E7800 Pure hypercholesterolemia, unspecified: Secondary | ICD-10-CM | POA: Diagnosis not present

## 2019-12-01 DIAGNOSIS — I10 Essential (primary) hypertension: Secondary | ICD-10-CM | POA: Diagnosis not present

## 2019-12-07 ENCOUNTER — Other Ambulatory Visit: Payer: Self-pay

## 2019-12-07 ENCOUNTER — Ambulatory Visit: Payer: Medicare HMO | Attending: Internal Medicine

## 2019-12-07 DIAGNOSIS — Z23 Encounter for immunization: Secondary | ICD-10-CM | POA: Insufficient documentation

## 2019-12-07 NOTE — Progress Notes (Signed)
   Covid-19 Vaccination Clinic  Name:  Sharon Campos    MRN: [id number] DOB: 06-12-1946  12/07/2019  Sharon Campos was observed post Covid-19 immunization for 15 minutes without incidence. She was provided with Vaccine Information Sheet and instruction to access the V-Safe system.   Sharon Campos was instructed to call 911 with any severe reactions post vaccine: Marland Kitchen Difficulty breathing  . Swelling of your face and throat  . A fast heartbeat  . A bad rash all over your body  . Dizziness and weakness    Immunizations Administered    Name Date Dose VIS Date Route   Moderna COVID-19 Vaccine 12/07/2019  2:33 PM 0.5 mL 09/22/2019 Intramuscular   Manufacturer: Moderna   Lot: [id number]   [id number]

## 2019-12-22 DIAGNOSIS — H04123 Dry eye syndrome of bilateral lacrimal glands: Secondary | ICD-10-CM | POA: Diagnosis not present

## 2019-12-22 DIAGNOSIS — H35033 Hypertensive retinopathy, bilateral: Secondary | ICD-10-CM | POA: Diagnosis not present

## 2019-12-22 DIAGNOSIS — H26493 Other secondary cataract, bilateral: Secondary | ICD-10-CM | POA: Diagnosis not present

## 2020-01-05 ENCOUNTER — Ambulatory Visit: Payer: Medicare HMO | Attending: Internal Medicine

## 2020-01-05 DIAGNOSIS — Z23 Encounter for immunization: Secondary | ICD-10-CM

## 2020-01-05 NOTE — Progress Notes (Signed)
   Covid-19 Vaccination Clinic  Name:  Sharon Campos    MRN: [id number] DOB: November 26, 1945  01/05/2020  [name] was observed post Covid-19 immunization for 15 minutes without incident. She was provided with Vaccine Information Sheet and instruction to access the V-Safe system.   [name] was instructed to call 911 with any severe reactions post vaccine: Marland Kitchen Difficulty breathing  . Swelling of face and throat  . A fast heartbeat  . A bad rash all over body  . Dizziness and weakness   Immunizations Administered    Name Date Dose VIS Date Route   Moderna COVID-19 Vaccine 01/05/2020  2:34 PM 0.5 mL 09/22/2019 Intramuscular   Manufacturer: Moderna   Lot: [id number]   [id number]

## 2020-02-10 DIAGNOSIS — E7800 Pure hypercholesterolemia, unspecified: Secondary | ICD-10-CM | POA: Diagnosis not present

## 2020-02-10 DIAGNOSIS — I10 Essential (primary) hypertension: Secondary | ICD-10-CM | POA: Diagnosis not present

## 2020-03-02 DIAGNOSIS — H26493 Other secondary cataract, bilateral: Secondary | ICD-10-CM | POA: Diagnosis not present

## 2020-03-02 DIAGNOSIS — H04123 Dry eye syndrome of bilateral lacrimal glands: Secondary | ICD-10-CM | POA: Diagnosis not present

## 2020-03-02 DIAGNOSIS — H35033 Hypertensive retinopathy, bilateral: Secondary | ICD-10-CM | POA: Diagnosis not present

## 2020-03-15 DIAGNOSIS — E7800 Pure hypercholesterolemia, unspecified: Secondary | ICD-10-CM | POA: Diagnosis not present

## 2020-03-15 DIAGNOSIS — I10 Essential (primary) hypertension: Secondary | ICD-10-CM | POA: Diagnosis not present

## 2020-03-16 DIAGNOSIS — H26492 Other secondary cataract, left eye: Secondary | ICD-10-CM | POA: Diagnosis not present

## 2020-05-13 DIAGNOSIS — E7800 Pure hypercholesterolemia, unspecified: Secondary | ICD-10-CM | POA: Diagnosis not present

## 2020-05-13 DIAGNOSIS — I10 Essential (primary) hypertension: Secondary | ICD-10-CM | POA: Diagnosis not present

## 2020-05-25 DIAGNOSIS — R200 Anesthesia of skin: Secondary | ICD-10-CM | POA: Diagnosis not present

## 2020-05-25 DIAGNOSIS — M65341 Trigger finger, right ring finger: Secondary | ICD-10-CM | POA: Diagnosis not present

## 2020-05-25 DIAGNOSIS — M720 Palmar fascial fibromatosis [Dupuytren]: Secondary | ICD-10-CM | POA: Diagnosis not present

## 2020-05-30 DIAGNOSIS — I10 Essential (primary) hypertension: Secondary | ICD-10-CM | POA: Diagnosis not present

## 2020-05-30 DIAGNOSIS — E7800 Pure hypercholesterolemia, unspecified: Secondary | ICD-10-CM | POA: Diagnosis not present

## 2020-06-17 DIAGNOSIS — I471 Supraventricular tachycardia: Secondary | ICD-10-CM | POA: Diagnosis not present

## 2020-06-17 DIAGNOSIS — I10 Essential (primary) hypertension: Secondary | ICD-10-CM | POA: Diagnosis not present

## 2020-06-17 DIAGNOSIS — M8580 Other specified disorders of bone density and structure, unspecified site: Secondary | ICD-10-CM | POA: Diagnosis not present

## 2020-06-17 DIAGNOSIS — Z0000 Encounter for general adult medical examination without abnormal findings: Secondary | ICD-10-CM | POA: Diagnosis not present

## 2020-06-17 DIAGNOSIS — H9190 Unspecified hearing loss, unspecified ear: Secondary | ICD-10-CM | POA: Diagnosis not present

## 2020-06-17 DIAGNOSIS — E7800 Pure hypercholesterolemia, unspecified: Secondary | ICD-10-CM | POA: Diagnosis not present

## 2020-06-17 DIAGNOSIS — M859 Disorder of bone density and structure, unspecified: Secondary | ICD-10-CM | POA: Diagnosis not present

## 2020-06-17 DIAGNOSIS — E049 Nontoxic goiter, unspecified: Secondary | ICD-10-CM | POA: Diagnosis not present

## 2020-06-17 DIAGNOSIS — Z23 Encounter for immunization: Secondary | ICD-10-CM | POA: Diagnosis not present

## 2020-06-17 DIAGNOSIS — E663 Overweight: Secondary | ICD-10-CM | POA: Diagnosis not present

## 2020-06-21 ENCOUNTER — Other Ambulatory Visit: Payer: Self-pay | Admitting: Family Medicine

## 2020-06-21 DIAGNOSIS — M8580 Other specified disorders of bone density and structure, unspecified site: Secondary | ICD-10-CM

## 2020-06-21 DIAGNOSIS — Z1231 Encounter for screening mammogram for malignant neoplasm of breast: Secondary | ICD-10-CM

## 2020-06-23 DIAGNOSIS — Z1211 Encounter for screening for malignant neoplasm of colon: Secondary | ICD-10-CM | POA: Diagnosis not present

## 2020-06-24 ENCOUNTER — Encounter: Payer: Self-pay | Admitting: Neurology

## 2020-06-24 DIAGNOSIS — M720 Palmar fascial fibromatosis [Dupuytren]: Secondary | ICD-10-CM | POA: Diagnosis not present

## 2020-06-24 DIAGNOSIS — R200 Anesthesia of skin: Secondary | ICD-10-CM | POA: Diagnosis not present

## 2020-06-28 ENCOUNTER — Other Ambulatory Visit: Payer: Self-pay

## 2020-06-28 DIAGNOSIS — R202 Paresthesia of skin: Secondary | ICD-10-CM

## 2020-07-19 DIAGNOSIS — I10 Essential (primary) hypertension: Secondary | ICD-10-CM | POA: Diagnosis not present

## 2020-07-19 DIAGNOSIS — E7800 Pure hypercholesterolemia, unspecified: Secondary | ICD-10-CM | POA: Diagnosis not present

## 2020-07-27 ENCOUNTER — Other Ambulatory Visit: Payer: Self-pay

## 2020-07-27 ENCOUNTER — Ambulatory Visit: Payer: Medicare HMO | Admitting: Neurology

## 2020-07-27 DIAGNOSIS — R202 Paresthesia of skin: Secondary | ICD-10-CM

## 2020-07-27 DIAGNOSIS — G5603 Carpal tunnel syndrome, bilateral upper limbs: Secondary | ICD-10-CM

## 2020-07-27 NOTE — Procedures (Signed)
[facility]  [place], [place]  [place],  [address] Tel: [phone] Fax:  [phone] Test Date:  07/27/2020  Patient: Sharon Campos DOB: 1946/03/17 Physician: Narda Amber, DO  Sex: Female Height: 5\' 4"  Ref Phys: Daryll Brod, MD  ID#: [id number] Temp: 34.0C Technician:    Patient Complaints: This is a 74 year old female referred for evaluation of bilateral hand paresthesias, worse on the right.  NCV & EMG Findings: Extensive testing of the right upper extremity and additional studies of the shows:  1. Right median sensory response shows prolonged latency (5.3 ms).  Left mixed palmar sensory responses shows mildly prolonged latency (Median Palm-Ulnar Palm, 0.5 ms).  Left median and bilateral ulnar sensory responses are within normal limits. 2. Right median motor response shows prolonged latency (4.1 ms).  Of note, there is evidence of a right Martin-Gruber anastomosis as seen by a greater proximal median amplitude and a motor response at the ulnar-wrist recording at the abductor pollicis brevis muscle.  Bilateral ulnar motor responses are within normal limits. 3. Chronic motor axonal loss changes are seen affecting the right abductor pollicis brevis, without accompanied active denervation.  The remaining tested muscles are within normal limits.   Impression: 1. Right median neuropathy at or distal to the wrist (moderate), consistent with a clinical diagnosis of carpal tunnel syndrome.   2. Left median neuropathy at or distal to the wrist (very mild), consistent with a clinical diagnosis of carpal tunnel syndrome.   3. Incidentally, there is a right Martin-Gruber anastomoses, a normal anatomic variant.   ___________________________ Narda Amber, DO    Nerve Conduction Studies Anti Sensory Summary Table   Stim Site NR Peak (ms) Norm Peak (ms) P-T Amp (V) Norm P-T Amp  Left Median Anti Sensory (2nd Digit)  34C  Wrist    3.6 <3.8 38.9 >10  Right Median  Anti Sensory (2nd Digit)  34C  Wrist    5.3 <3.8 14.0 >10  Left Ulnar Anti Sensory (5th Digit)  34C  Wrist    2.9 <3.2 45.4 >5  Right Ulnar Anti Sensory (5th Digit)  34C  Wrist    2.7 <3.2 40.8 >5   Motor Summary Table   Stim Site NR Onset (ms) Norm Onset (ms) O-P Amp (mV) Norm O-P Amp Site1 Site2 Delta-0 (ms) Dist (cm) Vel (m/s) Norm Vel (m/s)  Left Median Motor (Abd Poll Brev)  34C  Wrist    3.8 <4.0 7.5 >5 Elbow Wrist 4.5 24.0 53 >50  Elbow    8.3  7.2         Right Median Motor (Abd Poll Brev)  34C  Wrist    4.1 <4.0 5.1 >5 Elbow Wrist 7.5 26.0 50 >50  Elbow    11.6  5.5  Ulnar-wrist crossover Elbow 7.1 0.0    Ulnar-wrist crossover    4.5  6.0         Left Ulnar Motor (Abd Dig Minimi)  34C  Wrist    2.6 <3.1 15.3 >7 B Elbow Wrist 3.4 21.0 62 >50  B Elbow    6.0  14.7  A Elbow B Elbow 1.9 10.0 53 >50  A Elbow    7.9  14.4         Right Ulnar Motor (Abd Dig Minimi)  34C  Wrist    2.6 <3.1 11.9 >7 B Elbow Wrist 3.4 21.0 62 >50  B Elbow    6.0  11.7  A Elbow B Elbow 1.7 10.0 59 >  50  A Elbow    7.7  11.5          Comparison Summary Table   Stim Site NR Peak (ms) Norm Peak (ms) P-T Amp (V) Site1 Site2 Delta-P (ms) Norm Delta (ms)  Left Median/Ulnar Palm Comparison (Wrist - 8cm)  34C  Median Palm    2.1 <2.2 46.2 Median Palm Ulnar Palm 0.5   Ulnar Palm    1.6 <2.2 28.7       EMG   Side Muscle Ins Act Fibs Psw Fasc Number Recrt Dur Dur. Amp Amp. Poly Poly. Comment  Right 1stDorInt Nml Nml Nml Nml Nml Nml Nml Nml Nml Nml Nml Nml N/A  Right Abd Poll Brev Nml Nml Nml Nml 1- Rapid Some 1+ Some 1+ Some 1+ N/A  Right PronatorTeres Nml Nml Nml Nml Nml Nml Nml Nml Nml Nml Nml Nml N/A  Right Biceps Nml Nml Nml Nml Nml Nml Nml Nml Nml Nml Nml Nml N/A  Right Triceps Nml Nml Nml Nml Nml Nml Nml Nml Nml Nml Nml Nml N/A  Right Deltoid Nml Nml Nml Nml Nml Nml Nml Nml Nml Nml Nml Nml N/A  Left 1stDorInt Nml Nml Nml Nml Nml Nml Nml Nml Nml Nml Nml Nml N/A  Left Abd Poll Brev Nml Nml  Nml Nml Nml Nml Nml Nml Nml Nml Nml Nml N/A  Left PronatorTeres Nml Nml Nml Nml Nml Nml Nml Nml Nml Nml Nml Nml N/A      Waveforms:

## 2020-08-01 DIAGNOSIS — M24541 Contracture, right hand: Secondary | ICD-10-CM | POA: Diagnosis not present

## 2020-08-01 DIAGNOSIS — G5603 Carpal tunnel syndrome, bilateral upper limbs: Secondary | ICD-10-CM | POA: Diagnosis not present

## 2020-08-08 DIAGNOSIS — E7800 Pure hypercholesterolemia, unspecified: Secondary | ICD-10-CM | POA: Diagnosis not present

## 2020-08-08 DIAGNOSIS — I10 Essential (primary) hypertension: Secondary | ICD-10-CM | POA: Diagnosis not present

## 2020-09-22 ENCOUNTER — Emergency Department (HOSPITAL_BASED_OUTPATIENT_CLINIC_OR_DEPARTMENT_OTHER)
Admission: EM | Admit: 2020-09-22 | Discharge: 2020-09-22 | Disposition: A | Discharge status: Home / Self Care | Payer: Worker's Compensation | Attending: Emergency Medicine | Admitting: Emergency Medicine

## 2020-09-22 ENCOUNTER — Other Ambulatory Visit: Payer: Self-pay

## 2020-09-22 ENCOUNTER — Encounter (HOSPITAL_BASED_OUTPATIENT_CLINIC_OR_DEPARTMENT_OTHER): Payer: Self-pay

## 2020-09-22 ENCOUNTER — Emergency Department (HOSPITAL_BASED_OUTPATIENT_CLINIC_OR_DEPARTMENT_OTHER): Payer: Medicare HMO | Attending: Emergency Medicine

## 2020-09-22 DIAGNOSIS — W01198A Fall on same level from slipping, tripping and stumbling with subsequent striking against other object, initial encounter: Secondary | ICD-10-CM | POA: Diagnosis not present

## 2020-09-22 DIAGNOSIS — S0990XA Unspecified injury of head, initial encounter: Secondary | ICD-10-CM | POA: Diagnosis present

## 2020-09-22 DIAGNOSIS — Z79899 Other long term (current) drug therapy: Secondary | ICD-10-CM | POA: Diagnosis not present

## 2020-09-22 DIAGNOSIS — I10 Essential (primary) hypertension: Secondary | ICD-10-CM | POA: Diagnosis not present

## 2020-09-22 DIAGNOSIS — Y990 Civilian activity done for income or pay: Secondary | ICD-10-CM | POA: Insufficient documentation

## 2020-09-22 HISTORY — DX: Tachycardia, unspecified: R00.0

## 2020-09-22 HISTORY — DX: Essential (primary) hypertension: I10

## 2020-09-22 HISTORY — DX: Pure hypercholesterolemia, unspecified: E78.00

## 2020-09-22 MED ORDER — ACETAMINOPHEN 500 MG PO TABS
1000.0000 mg | ORAL_TABLET | Freq: Once | ORAL | Status: AC
  Administered 2020-09-22: 1000 mg via ORAL
  Filled 2020-09-22: qty 2

## 2020-09-22 MED ORDER — HYDROMORPHONE HCL 1 MG/ML IJ SOLN: 1.0000 mg | Freq: Once | INTRAMUSCULAR | Status: DC

## 2020-09-22 NOTE — ED Triage Notes (Addendum)
Pt states she tripped at work ~1.5 hours-fell backwards on tile floor-pain to top of head, right side of neck and "teeth feel jarred"-no LOC-no blood thinners-NAD-to triage in w/c

## 2020-09-22 NOTE — ED Provider Notes (Signed)
[name] Provider Note   CSN: [id number] Arrival date & time: 09/22/20  1130     History Chief Complaint  Patient presents with  . Fall    Sharon Campos is a 74 y.o. female.  HPI Patient was at work.  She reports she turned and misstepped causing herself to twist and fall backwards.  She reports she struck the back of her head on the right side on a concrete floor.  She reports she was dazed but not knocked out.  She had a headache afterwards but reports it feels like it is improving now.  Did not develop any nausea or vomiting.  No visual changes.  Denies pain in her neck.  She is not on any anticoagulant medications.  No weakness numbness or tingling to the extremities.  She reports after she sat for a bit she felt recovered and was able to ambulate to her daughter's car to drive to the emergency department.  She has been up and ambulatory to the bathroom without difficulty.  She denies any injury to her upper or lower extremities.    Past Medical History:  Diagnosis Date  . High cholesterol   . Hypertension   . Tachycardia     There are no problems to display for this patient.   Past Surgical History:  Procedure Laterality Date  . APPENDECTOMY    . CATARACT EXTRACTION    . CHOLECYSTECTOMY       OB History   No obstetric history on file.     Family History  Problem Relation Age of Onset  . Breast cancer Neg Hx     Social History   Tobacco Use  . Smoking status: Never Smoker  . Smokeless tobacco: Never Used  Vaping Use  . Vaping Use: Never used  Substance Use Topics  . Alcohol use: Yes    Comment: occ  . Drug use: Never    Home Medications Prior to Admission medications   Medication Sig Start Date End Date Taking? Authorizing Provider  atorvastatin (LIPITOR) 40 MG tablet Take 40 mg by mouth daily.   Yes [provider]  enalapril (VASOTEC) 10 MG tablet Take 10 mg by mouth daily.   Yes [provider]  metoprolol tartrate (LOPRESSOR) 25 MG tablet Take 25 mg by mouth 2 (two) times daily.   Yes [provider]    Allergies    Ceclor [cefaclor], Codeine, Darvon [propoxyphene], and Neosporin [neomycin-polymyxin-gramicidin]  Review of Systems   Review of Systems 10 systems reviewed and negative except as per HPI Physical Exam Updated Vital Signs BP (!) 160/84 (BP Location: Right Arm)   Pulse (!) 53   Temp 98.7 F (37.1 C) (Oral)   Resp 18   Ht 5\' 4"  (1.626 m)   Wt 68 kg   LMP  (LMP Unknown)   SpO2 100%   BMI 25.75 kg/m   Physical Exam Constitutional:      Appearance: She is well-developed.  HENT:     Head:     Comments: Small hematoma to the right parietal scalp.  No laceration or bleeding.    Right Ear: Tympanic membrane normal.     Left Ear: Tympanic membrane normal.     Nose: Nose normal.     Mouth/Throat:     Mouth: Mucous membranes are dry.  Eyes:     Pupils: Pupils are equal, round, and reactive to light.  Neck:     Comments: No midline C-spine  tenderness. Cardiovascular:     Rate and Rhythm: Normal rate and regular rhythm.     Heart sounds: Normal heart sounds.  Pulmonary:     Effort: Pulmonary effort is normal.     Breath sounds: Normal breath sounds.  Abdominal:     General: Bowel sounds are normal. There is no distension.     Palpations: Abdomen is soft.     Tenderness: There is no abdominal tenderness.  Musculoskeletal:        General: Normal range of motion.     Cervical back: Neck supple.  Skin:    General: Skin is warm and dry.  Neurological:     General: No focal deficit present.     Mental Status: She is alert and oriented to person, place, and time.     GCS: GCS eye subscore is 4. GCS verbal subscore is 5. GCS motor subscore is 6.     Cranial Nerves: No cranial nerve deficit.     Motor: No weakness.     Coordination: Coordination normal.     ED Results / Procedures / Treatments   Labs (all labs ordered are  listed, but only abnormal results are displayed) Labs Reviewed - No data to display  EKG None  Radiology CT Head Wo Contrast  Result Date: 09/22/2020 CLINICAL DATA:  Pain following fall EXAM: CT HEAD WITHOUT CONTRAST TECHNIQUE: Contiguous axial images were obtained from the base of the skull through the vertex without intravenous contrast. COMPARISON:  Brain MRI June 16, 2008 FINDINGS: Brain: There is mild frontal atrophy bilaterally. Ventricles and sulci elsewhere are within normal limits for age. There is no intracranial mass, hemorrhage, extra-axial fluid collection, or midline shift. The brain parenchyma appears unremarkable. No appreciable acute infarct. Vascular: No hyperdense vessels. No arterial vascular calcification evident. Skull: The bony calvarium appears intact. Scattered prominent venous lakes are felt to represent an anatomic variant. Sinuses/Orbits: Visualized paranasal sinuses are clear. Visualized orbits appear symmetric bilaterally. Other: Mastoid air cells are clear. IMPRESSION: Mild frontal atrophy bilaterally. Ventricles and sulci elsewhere within normal limits for age. Brain parenchyma appears unremarkable without evident acute infarct. No mass or hemorrhage. Electronically Signed   By: Lowella Grip III M.D.   On: 09/22/2020 13:41    Procedures Procedures (including critical care time)  Medications Ordered in ED Medications  acetaminophen (TYLENOL) tablet 1,000 mg (has no administration in time range)    ED Course  I have reviewed the triage vital signs and the nursing notes.  Pertinent labs & imaging results that were available during my care of the patient were reviewed by me and considered in my medical decision making (see chart for details).    MDM Rules/Calculators/A&P                          Patient had mechanical fall as outlined above.  She struck her head fairly hard on a hard surface.  She was dazed.  She did not lose complete consciousness.   Patient has had a persistent right-sided headache.  Neurologic exam is normal.  Patient has not had any visual changes, nausea or vomiting.  CT head does not show any acute intracranial bleed.  At this time, we have reviewed return precautions and symptoms of concussion.  Patient is stable for discharge.  She is with her daughter who will be observing her.  Maxie Better will be for acetaminophen as needed for headache.  Rest and hydrate for the next 48  hours.  Follow-up with the PCP and return precautions reviewed. Final Clinical Impression(s) / ED Diagnoses Final diagnoses:  Injury of head, initial encounter    Rx / DC Orders ED Discharge Orders    None       Charlesetta Shanks, MD 09/22/20 1358

## 2020-09-22 NOTE — Discharge Instructions (Signed)
1.  Take acetaminophen for pain every 4-6 hours as needed. 2.  Rest and hydrate for the next 48 hours. 3.  Return to the emergency department if you have a significantly increasing headache, confusion, nausea and vomiting, imbalance with your gait or other concerning symptoms. 4.  Make a follow-up appointment with your doctor within the next 2 to 3 days.

## 2020-09-23 ENCOUNTER — Other Ambulatory Visit: Payer: Medicare HMO

## 2020-09-23 ENCOUNTER — Ambulatory Visit: Payer: Medicare HMO

## 2020-09-30 DIAGNOSIS — S060X0D Concussion without loss of consciousness, subsequent encounter: Secondary | ICD-10-CM | POA: Diagnosis not present

## 2020-09-30 DIAGNOSIS — I10 Essential (primary) hypertension: Secondary | ICD-10-CM | POA: Diagnosis not present

## 2020-09-30 DIAGNOSIS — Z6830 Body mass index (BMI) 30.0-30.9, adult: Secondary | ICD-10-CM | POA: Diagnosis not present

## 2020-10-03 DIAGNOSIS — M720 Palmar fascial fibromatosis [Dupuytren]: Secondary | ICD-10-CM | POA: Diagnosis not present

## 2020-11-23 DIAGNOSIS — E7800 Pure hypercholesterolemia, unspecified: Secondary | ICD-10-CM | POA: Diagnosis not present

## 2020-11-23 DIAGNOSIS — M8580 Other specified disorders of bone density and structure, unspecified site: Secondary | ICD-10-CM | POA: Diagnosis not present

## 2020-11-23 DIAGNOSIS — I10 Essential (primary) hypertension: Secondary | ICD-10-CM | POA: Diagnosis not present

## 2020-12-26 DIAGNOSIS — G5603 Carpal tunnel syndrome, bilateral upper limbs: Secondary | ICD-10-CM | POA: Diagnosis not present

## 2020-12-26 DIAGNOSIS — M24541 Contracture, right hand: Secondary | ICD-10-CM | POA: Diagnosis not present

## 2021-01-23 ENCOUNTER — Other Ambulatory Visit: Payer: Self-pay | Admitting: Family Medicine

## 2021-01-23 DIAGNOSIS — M8580 Other specified disorders of bone density and structure, unspecified site: Secondary | ICD-10-CM

## 2021-01-24 ENCOUNTER — Ambulatory Visit
Admission: RE | Admit: 2021-01-24 | Discharge: 2021-01-24 | Disposition: A | Discharge status: Home / Self Care | Payer: Medicare HMO | Source: Ambulatory Visit | Attending: Family Medicine | Admitting: Family Medicine

## 2021-01-24 ENCOUNTER — Other Ambulatory Visit: Payer: Medicare HMO

## 2021-01-24 ENCOUNTER — Other Ambulatory Visit: Payer: Self-pay

## 2021-01-24 DIAGNOSIS — Z1231 Encounter for screening mammogram for malignant neoplasm of breast: Secondary | ICD-10-CM

## 2021-02-28 DIAGNOSIS — I10 Essential (primary) hypertension: Secondary | ICD-10-CM | POA: Diagnosis not present

## 2021-02-28 DIAGNOSIS — M8580 Other specified disorders of bone density and structure, unspecified site: Secondary | ICD-10-CM | POA: Diagnosis not present

## 2021-02-28 DIAGNOSIS — E7800 Pure hypercholesterolemia, unspecified: Secondary | ICD-10-CM | POA: Diagnosis not present

## 2021-04-03 DIAGNOSIS — R251 Tremor, unspecified: Secondary | ICD-10-CM | POA: Diagnosis not present

## 2021-04-17 DIAGNOSIS — H04123 Dry eye syndrome of bilateral lacrimal glands: Secondary | ICD-10-CM | POA: Diagnosis not present

## 2021-04-17 DIAGNOSIS — Z961 Presence of intraocular lens: Secondary | ICD-10-CM | POA: Diagnosis not present

## 2021-04-17 DIAGNOSIS — H35033 Hypertensive retinopathy, bilateral: Secondary | ICD-10-CM | POA: Diagnosis not present

## 2021-05-29 DIAGNOSIS — E7800 Pure hypercholesterolemia, unspecified: Secondary | ICD-10-CM | POA: Diagnosis not present

## 2021-05-29 DIAGNOSIS — M8580 Other specified disorders of bone density and structure, unspecified site: Secondary | ICD-10-CM | POA: Diagnosis not present

## 2021-05-29 DIAGNOSIS — I10 Essential (primary) hypertension: Secondary | ICD-10-CM | POA: Diagnosis not present

## 2021-06-06 DIAGNOSIS — R200 Anesthesia of skin: Secondary | ICD-10-CM | POA: Diagnosis not present

## 2021-06-06 DIAGNOSIS — I10 Essential (primary) hypertension: Secondary | ICD-10-CM | POA: Diagnosis not present

## 2021-06-27 DIAGNOSIS — I10 Essential (primary) hypertension: Secondary | ICD-10-CM | POA: Diagnosis not present

## 2021-06-27 DIAGNOSIS — I471 Supraventricular tachycardia: Secondary | ICD-10-CM | POA: Diagnosis not present

## 2021-06-27 DIAGNOSIS — E7800 Pure hypercholesterolemia, unspecified: Secondary | ICD-10-CM | POA: Diagnosis not present

## 2021-06-27 DIAGNOSIS — R251 Tremor, unspecified: Secondary | ICD-10-CM | POA: Diagnosis not present

## 2021-06-29 DIAGNOSIS — Z1389 Encounter for screening for other disorder: Secondary | ICD-10-CM | POA: Diagnosis not present

## 2021-06-29 DIAGNOSIS — Z0000 Encounter for general adult medical examination without abnormal findings: Secondary | ICD-10-CM | POA: Diagnosis not present

## 2021-07-06 ENCOUNTER — Other Ambulatory Visit: Payer: Self-pay

## 2021-07-06 ENCOUNTER — Ambulatory Visit
Admission: RE | Admit: 2021-07-06 | Discharge: 2021-07-06 | Disposition: A | Discharge status: Home / Self Care | Payer: Medicare HMO | Source: Ambulatory Visit | Attending: Family Medicine | Admitting: Family Medicine

## 2021-07-06 DIAGNOSIS — M8580 Other specified disorders of bone density and structure, unspecified site: Secondary | ICD-10-CM

## 2021-07-06 DIAGNOSIS — M8589 Other specified disorders of bone density and structure, multiple sites: Secondary | ICD-10-CM | POA: Diagnosis not present

## 2021-07-06 DIAGNOSIS — Z780 Asymptomatic menopausal state: Secondary | ICD-10-CM | POA: Diagnosis not present

## 2021-07-28 DIAGNOSIS — Z23 Encounter for immunization: Secondary | ICD-10-CM | POA: Diagnosis not present

## 2021-07-28 DIAGNOSIS — I10 Essential (primary) hypertension: Secondary | ICD-10-CM | POA: Diagnosis not present

## 2021-07-28 DIAGNOSIS — M8580 Other specified disorders of bone density and structure, unspecified site: Secondary | ICD-10-CM | POA: Diagnosis not present

## 2021-07-28 DIAGNOSIS — R251 Tremor, unspecified: Secondary | ICD-10-CM | POA: Diagnosis not present

## 2021-08-03 ENCOUNTER — Encounter: Payer: Self-pay | Admitting: Neurology

## 2021-08-15 NOTE — Progress Notes (Signed)
Assessment/Plan:    1,  Parkinson's disease.  The patient has tremor, bradykinesia, rigidity   -We discussed the diagnosis as well as pathophysiology of the disease.  We discussed treatment options as well as prognostic indicators.  Patient education was provided.  -We discussed that it used to be thought that levodopa would increase risk of melanoma but now it is believed that Parkinsons itself likely increases risk of melanoma. she is to get regular skin checks.  -We decided to add carbidopa/levodopa 25/100.  1/2 tab tid x 1 wk, then 1/2 in am & noon & 1 at night for a week, then 1/2 in am &1 at noon &night for a week, then 1 po tid.  Risks, benefits, side effects and alternative therapies were discussed.  The opportunity to ask questions was given and they were answered to the best of my ability.  The patient expressed understanding and willingness to follow the outlined treatment protocols.  -We discussed community resources in the area including patient support groups and community exercise programs for PD and pt education was provided to the patient.  She is currently attending [facility].  We discussed the addition of safe, cardiovascular exercise.  -We will do an MRI brain.  Her CT demonstrated some frontotemporal atrophy and I want to get a better look at that.  She is claustrophobic and wants that [facility].    -She met with my LCSW today.   2.  Cervical dystonia  -Can go along with #1, but can also be associated with other atypical states.  She has no red flags associated with that.  Discussed with patient that time will be the thing that separates Parkinson's disease from other atypical state.  Treatment is really the same.  Subjective:   Sharon Campos was seen today in the movement disorders clinic for neurologic consultation at the request of Kathyrn Lass, MD.  The consultation is for the evaluation of right hand rest tremor.   Specific Symptoms:  Tremor: Yes.   X  several months - R hand only - she noted it first when she was walking and hand by the side; now she notes it at rest.  She is R hand dominant Family hx of similar:  No. Voice: more trouble singing - cannot reach notes that used to Sleep: awakenings in night - some to use RR  Vivid Dreams:  No.  Acting out dreams:  No. Wet Pillows: No. Postural symptoms:  Yes.    Falls?  Yes.   patient was in the emergency room in December, 21 after a fall at work.  She apparently just turned and lost balance and fell backwards and hit her head on the concrete floor, without loss of consciousness.  She was evaluated in the emergency room and sent home following the evaluation.  That was only fall she has had Bradykinesia symptoms: shuffling gait (she used to do this but doesn't now that silver sneakers has told her not to do this) Loss of smell:  No. Loss of taste:  Yes.   Urinary Incontinence:  No. Difficulty Swallowing:  occasional trouble with first bites in AM Handwriting, micrographia: No.  Trouble with ADL's:  No.  Trouble buttoning clothing: No. Depression:  No. Memory changes:  No. But has some word finding and naming trouble N/V:  No. Lightheaded:  No.  Syncope: No. Diplopia:  Yes.  , but only if reading for hours Dyskinesia:  No. Prior exposure to reglan/antipsychotics: No.  CT brain rest recently performed December, 21 after a fall.  This demonstrated atrophy, much more so in the frontal head regions than elsewhere.  This was personally reviewed.  PREVIOUS MEDICATIONS: none to date  ALLERGIES:   Allergies  Allergen Reactions   Carbamazepine Other (See Comments)   Ceclor [Cefaclor]    Codeine    Darvon [Propoxyphene]    Neosporin [Neomycin-Polymyxin-Gramicidin]     CURRENT MEDICATIONS:  Current Outpatient Medications  Medication Instructions   alendronate (FOSAMAX) 70 mg, Oral, Weekly, Take with a full glass of water on an empty stomach.   amLODipine (NORVASC) 10 MG tablet No  dose, route, or frequency recorded.   atorvastatin (LIPITOR) 40 mg, Oral, Daily   calcium citrate-vitamin D (CITRACAL+D) 315-200 MG-UNIT tablet 1 tablet, Oral, 2 times daily   enalapril (VASOTEC) 10 mg, Oral, Daily   Ketotifen Fumarate (REFRESH EYE ITCH RELIEF OP) Ophthalmic, As needed   metoprolol tartrate (LOPRESSOR) 25 mg, Oral, 2 times daily   Multiple Vitamin (MULTIVITAMIN) capsule 1 capsule, Oral, Daily   naproxen (NAPROSYN) 250 MG tablet Oral, As needed   Omega-3 Fatty Acids (OMEGA-3 FISH OIL) 1200 MG CAPS Oral    Objective:   VITALS:   Vitals:   08/17/21 0905  BP: 126/78  Pulse: 78  SpO2: 98%  Weight: 156 lb 3.2 oz (70.9 kg)  Height: 5' 2" (1.575 m)    GEN:  The patient appears stated age and is in NAD. HEENT:  Normocephalic, atraumatic.  The mucous membranes are moist. The superficial temporal arteries are without ropiness or tenderness. CV:  RRR Lungs:  CTAB Neck/HEME:  There are no carotid bruits bilaterally.  Neck is turned to the L  Neurological examination:  Orientation: The patient is alert and oriented x3.  Cranial nerves: There is good facial symmetry. Extraocular muscles are intact. The visual fields are full to confrontational testing. The speech is fluent and clear. Soft palate rises symmetrically and there is no tongue deviation. Hearing is intact to conversational tone. Sensation: Sensation is intact to light and pinprick throughout (facial, trunk, extremities). Vibration is intact at the bilateral big toe. There is no extinction with double simultaneous stimulation. There is no sensory dermatomal level identified. Motor: Strength is 5/5 in the bilateral upper and lower extremities.   Shoulder shrug is equal and symmetric.  There is no pronator drift. Deep tendon reflexes: Deep tendon reflexes are 2/4 at the bilateral biceps, triceps, brachioradialis, 1/4 at the bilateral patella and absent at the bilateral achilles. Plantar responses are downgoing  bilaterally.  Movement examination: Tone: There is mod increased tone in the RUE Abnormal movements: Does not have much rest tremor while seated in the chair, including with distraction procedures.  When she ambulates, she has a bit of thumb tremor on the right Coordination:  There is  decremation with RAM's, with any form of RAMS, including alternating supination and pronation of the forearm, hand opening and closing, finger taps, heel taps and toe taps on the R only Gait and Station: The patient has no difficulty arising out of a deep-seated chair without the use of the hands. The patient's stride length is good but she has just a tiny bit of thumb tremor with ambulation on the right.      Total time spent on today's visit was 60 minutes, including both face-to-face time and nonface-to-face time.  Time included that spent on review of records (prior notes available to me/labs/imaging if pertinent), discussing treatment and goals, answering patient's questions and  coordinating care.  Cc:  Kathyrn Lass, MD

## 2021-08-17 ENCOUNTER — Other Ambulatory Visit: Payer: Self-pay

## 2021-08-17 ENCOUNTER — Encounter: Payer: Self-pay | Admitting: Neurology

## 2021-08-17 ENCOUNTER — Ambulatory Visit: Payer: Medicare HMO | Admitting: Neurology

## 2021-08-17 VITALS — BP 126/78 | HR 78 | Ht 62.0 in | Wt 156.2 lb

## 2021-08-17 DIAGNOSIS — G20 Parkinson's disease: Secondary | ICD-10-CM

## 2021-08-17 DIAGNOSIS — G20A1 Parkinson's disease without dyskinesia, without mention of fluctuations: Secondary | ICD-10-CM | POA: Insufficient documentation

## 2021-08-17 MED ORDER — CARBIDOPA-LEVODOPA 25-100 MG PO TABS: 1.0000 | ORAL_TABLET | Freq: Three times a day (TID) | ORAL | 1 refills | Status: DC

## 2021-08-17 NOTE — Patient Instructions (Signed)
As we discussed, it used to be thought that levodopa would increase risk of melanoma but now it is believed that Parkinsons itself likely increases risk of melanoma. I recommend yearly skin checks with a board certified dermatologist.  You can call [facility] or [facility] for an appointment.  [facility] Address: [address], [place], [place] [address] Phone: [phone]  [facility], [place], [place] Phone: [phone]  Start Carbidopa Levodopa as follows: Take 1/2 tablet three times daily, at least 30 minutes before meals (approximately 7am/11am/4pm), for one week Then take 1/2 tablet in the morning, 1/2 tablet in the afternoon, 1 tablet in the evening, at least 30 minutes before meals, for one week Then take 1/2 tablet in the morning, 1 tablet in the afternoon, 1 tablet in the evening, at least 30 minutes before meals, for one week Then take 1 tablet three times daily at 7am/11am/4pm, at least 30 minutes before meals   As a reminder, carbidopa/levodopa can be taken at the same time as a carbohydrate, but we like to have you take your pill either 30 minutes before a protein source or 1 hour after as protein can interfere with carbidopa/levodopa absorption.

## 2021-08-24 ENCOUNTER — Telehealth: Payer: Self-pay | Admitting: Neurology

## 2021-08-24 NOTE — Telephone Encounter (Signed)
Patient called with questions about carbidopa levodopa 25-100 MG before she starts it.

## 2021-08-25 DIAGNOSIS — I10 Essential (primary) hypertension: Secondary | ICD-10-CM | POA: Diagnosis not present

## 2021-08-25 DIAGNOSIS — E7800 Pure hypercholesterolemia, unspecified: Secondary | ICD-10-CM | POA: Diagnosis not present

## 2021-08-25 DIAGNOSIS — M8580 Other specified disorders of bone density and structure, unspecified site: Secondary | ICD-10-CM | POA: Diagnosis not present

## 2021-08-25 NOTE — Telephone Encounter (Signed)
Called patient for a second time today and LVM

## 2021-08-30 ENCOUNTER — Telehealth: Payer: Self-pay | Admitting: Neurology

## 2021-08-30 NOTE — Telephone Encounter (Signed)
Patient said she is waiting on a call from someone regarding her previous questions. She has not taken her new meds yet cause she has not spoken to anyone. Said please call b/t 1-4pm

## 2021-08-30 NOTE — Telephone Encounter (Signed)
Will call pt back around 1pm per request

## 2021-08-30 NOTE — Telephone Encounter (Signed)
Pt called no answer left a voice mail stating  that Dr Tat will not be in the office at 1 pm and that it may be tomorrow before her question is answered if I can not answer when she calls back

## 2021-08-30 NOTE — Telephone Encounter (Signed)
Pt called back still no answer left a voice mail to call the office back to see if I could answer her questions

## 2021-08-31 DIAGNOSIS — G20 Parkinson's disease: Secondary | ICD-10-CM | POA: Diagnosis not present

## 2021-08-31 DIAGNOSIS — R519 Headache, unspecified: Secondary | ICD-10-CM | POA: Diagnosis not present

## 2021-08-31 NOTE — Telephone Encounter (Signed)
Pt called no answer left a voice mail for pt to call back

## 2021-09-01 NOTE — Telephone Encounter (Signed)
Pt called for 4th time no answer left a voice mail to call the office back. Will close out this phone note at this time,

## 2021-09-25 DIAGNOSIS — D225 Melanocytic nevi of trunk: Secondary | ICD-10-CM | POA: Diagnosis not present

## 2021-09-25 DIAGNOSIS — D2271 Melanocytic nevi of right lower limb, including hip: Secondary | ICD-10-CM | POA: Diagnosis not present

## 2021-09-25 DIAGNOSIS — L819 Disorder of pigmentation, unspecified: Secondary | ICD-10-CM | POA: Diagnosis not present

## 2021-09-25 DIAGNOSIS — D692 Other nonthrombocytopenic purpura: Secondary | ICD-10-CM | POA: Diagnosis not present

## 2021-09-25 DIAGNOSIS — L918 Other hypertrophic disorders of the skin: Secondary | ICD-10-CM | POA: Diagnosis not present

## 2021-09-25 DIAGNOSIS — D224 Melanocytic nevi of scalp and neck: Secondary | ICD-10-CM | POA: Diagnosis not present

## 2021-09-25 DIAGNOSIS — L821 Other seborrheic keratosis: Secondary | ICD-10-CM | POA: Diagnosis not present

## 2021-09-25 DIAGNOSIS — L570 Actinic keratosis: Secondary | ICD-10-CM | POA: Diagnosis not present

## 2021-09-25 DIAGNOSIS — D2239 Melanocytic nevi of other parts of face: Secondary | ICD-10-CM | POA: Diagnosis not present

## 2021-10-30 DIAGNOSIS — Z1211 Encounter for screening for malignant neoplasm of colon: Secondary | ICD-10-CM | POA: Diagnosis not present

## 2021-12-18 ENCOUNTER — Other Ambulatory Visit: Payer: Self-pay | Admitting: Family Medicine

## 2021-12-18 DIAGNOSIS — Z1231 Encounter for screening mammogram for malignant neoplasm of breast: Secondary | ICD-10-CM

## 2022-01-08 DIAGNOSIS — E7800 Pure hypercholesterolemia, unspecified: Secondary | ICD-10-CM | POA: Diagnosis not present

## 2022-01-08 DIAGNOSIS — G20 Parkinson's disease: Secondary | ICD-10-CM | POA: Diagnosis not present

## 2022-01-08 DIAGNOSIS — I471 Supraventricular tachycardia: Secondary | ICD-10-CM | POA: Diagnosis not present

## 2022-01-08 DIAGNOSIS — I10 Essential (primary) hypertension: Secondary | ICD-10-CM | POA: Diagnosis not present

## 2022-01-08 DIAGNOSIS — E663 Overweight: Secondary | ICD-10-CM | POA: Diagnosis not present

## 2022-01-08 DIAGNOSIS — M8580 Other specified disorders of bone density and structure, unspecified site: Secondary | ICD-10-CM | POA: Diagnosis not present

## 2022-01-23 NOTE — Progress Notes (Signed)
? ? ?Assessment/Plan:  ? ?1.  Parkinsons Disease, diagnosed October, 2022 ? -Continue carbidopa/levodopa 25/100, 1 tablet 3 times per day ? -looks much better today ? -doing great with exercise!  Told her to add in cardio ? ?2.  Cervical dystonia ? -this really resolved with tx for Parkinsons Disease.  Wasn't previously bothersome to pt ? ?Subjective:  ? ?Sharon Campos was seen today in follow up for Parkinsons disease, diagnosed last visit.  My previous records were reviewed prior to todays visit as well as outside records available to me.  Daughter supplements hx.  Levodopa was started after our last visit.  She did call several days later and stated she had questions about the medication and had not started it.  We called her back four different times, but were unable to reach her.  Patient states today that she ultimately did start the medication and she notes it is helpful.  She didn't realize it was helpful until a month ago.  When it wears off, she will have tremor.  She sleeps better than in the past.  She is going to ACT and does yoga.  pt denies falls.  Pt with rare lightheadedness,  but no near syncope.  No hallucinations.  Mood has been good.  MRI brain was ordered after our last visit and completed at [place] [facility]  There was reported to be small vessel disease ? ?Current prescribed movement disorder medications: ?Carbidopa/levodopa 25/100, 1 tablet 3 times per day (started last visit) ? ? ? ?ALLERGIES:   ?Allergies  ?Allergen Reactions  ? Carbamazepine Other (See Comments)  ? Ceclor [Cefaclor]   ? Codeine   ? Darvon [Propoxyphene]   ? Neosporin [Neomycin-Polymyxin-Gramicidin]   ? ? ?CURRENT MEDICATIONS:  ?Current Meds  ?Medication Sig  ? alendronate (FOSAMAX) 70 MG tablet Take 70 mg by mouth once a week. Take with a full glass of water on an empty stomach.  ? amLODipine (NORVASC) 10 MG tablet   ? atorvastatin (LIPITOR) 40 MG tablet Take 40 mg by mouth daily.  ? calcium citrate-vitamin  D (CITRACAL+D) 315-200 MG-UNIT tablet Take 1 tablet by mouth 2 (two) times daily.  ? carbidopa-levodopa (SINEMET IR) 25-100 MG tablet Take 1 tablet by mouth 3 (three) times daily. 7am/11am/4pm  ? diclofenac Sodium (VOLTAREN) 1 % GEL Apply topically 4 (four) times daily.  ? enalapril (VASOTEC) 10 MG tablet Take 10 mg by mouth daily.  ? Ketotifen Fumarate (REFRESH EYE ITCH RELIEF OP) Apply to eye as needed.  ? metoprolol tartrate (LOPRESSOR) 25 MG tablet Take 25 mg by mouth 2 (two) times daily.  ? Multiple Vitamin (MULTIVITAMIN) capsule Take 1 capsule by mouth daily.  ? naproxen (NAPROSYN) 250 MG tablet Take by mouth as needed.  ? Omega-3 Fatty Acids (OMEGA-3 FISH OIL) 1200 MG CAPS Take by mouth.  ? ? ? ?Objective:  ? ?PHYSICAL EXAMINATION:   ? ?VITALS:   ?Vitals:  ? 01/24/22 0949  ?BP: 124/82  ?Pulse: 71  ?SpO2: 99%  ?Weight: 159 lb 3.2 oz (72.2 kg)  ?Height: '5\' 2"'$  (1.575 m)  ? ? ?GEN:  The patient appears stated age and is in NAD. ?HEENT:  Normocephalic, atraumatic.  The mucous membranes are moist. The superficial temporal arteries are without ropiness or tenderness. ?CV:  RRR ?Lungs:  CTAB ?Neck/HEME:  There are no carotid bruits bilaterally. ? ?Neurological examination: ? ?Orientation: The patient is alert and oriented x3. ?Cranial nerves: There is good facial symmetry without facial hypomimia. The speech  is fluent and clear. Soft palate rises symmetrically and there is no tongue deviation. Hearing is intact to conversational tone. ?Sensation: Sensation is intact to light touch throughout ?Motor: Strength is at least antigravity x4. ? ?Movement examination: ?Tone: There is normal tone in the RUE ?Abnormal movements: none today ?Coordination:  There is no decremation, with any form of RAMS, including alternating supination and pronation of the forearm, hand opening and closing, finger taps, heel taps and toe taps. ?Gait and Station: The patient has no difficulty arising out of a deep-seated chair without the use of  the hands. The patient's stride length is good but she has just a tiny bit of thumb tremor with ambulation on the right.   ? ? ? ?Cc:  Kathyrn Lass, MD ? ?

## 2022-01-24 ENCOUNTER — Ambulatory Visit: Payer: Medicare HMO | Admitting: Neurology

## 2022-01-24 ENCOUNTER — Encounter: Payer: Self-pay | Admitting: Neurology

## 2022-01-24 VITALS — BP 124/82 | HR 71 | Ht 62.0 in | Wt 159.2 lb

## 2022-01-24 DIAGNOSIS — G20 Parkinson's disease: Secondary | ICD-10-CM

## 2022-01-24 NOTE — Patient Instructions (Signed)
Local and Online Resources for Power over Parkinson's Group ?April 2023 ? ?LOCAL [place] PARKINSON'S GROUPS  ?Power over Parkinson's Group:   ?Power Over Parkinson's Patient Education Group will be Wednesday, April 12th-*Hybrid meting*- in person at [facility] location and via [facility] at 2:00 pm.   ?Upcoming Power over Parkinson's Meetings:  2nd Wednesdays of the month at 2 pm:  April 12th, May 10th ?Contact Amy Marriott at [email or web address] if interested in participating in this group ?Parkinson's Care Partners Group:    3rd Mondays, Contact Misty Paladino ?Atypical Parkinsonian Patient Group:   4th Wednesdays, Contact Misty Paladino ?If you are interested in participating in these groups with Misty, please contact her directly for how to join those meetings.  Her contact information is [email or web address].   ? ?LOCAL EVENTS AND NEW OFFERINGS ?Dine out at [employer].  Celebrate Parkinson's disease Awareness Month and Support the Parkinson's Movement Disorder Fund.   Wednesday, April 19th 4-6 pm at [place], [employer].  (Give receipt to cashier and 20% will be donated) ?[place]!  Play [place]!  Join [place] for home game for a fun evening to bring awareness of Parkinson's and raise funds for our Movement Disorder Funds. April 28th 6:30 pm [address] To purchase tickets:  [email or web address] ?Parkinson's T-shirts for sale!  Designed by a local group member, with funds going to [place].  $20.00  Contact Misty to purchase (see email above) ?New PWR! Moves Dynegy Instructor-Led Class offering at [employer]! Starting Wednesdays 1-2 pm, starting April 12th.   Contact Bryson Dames, Acupuncturist at [employer].  Manuela [email or web address] ? ?ONLINE EDUCATION AND SUPPORT ?[place]:  www.[email or web address] ?PD Health at Home continues:  Mindfulness Mondays, Expert Briefing Tuesdays,  Wellness Wednesdays, Take Time Thursdays, Fitness Fridays  ?Upcoming Education:  ?Freezing and Fall Prevention in Parkinson's.  Wednesday, April 12th at 1:00 pm ?Understanding Gene and Cell-Based Therapies in Parkinson's.  Wednesday, May 10th at 1:00 pm ?Register for expert briefings (webinars) at [email or web address] ?Please check out their website to sign up for emails and see their full online offerings ? ? ?[place]:  [email or web address]  ?Third Thursday Webinars:  On the third Thursday of every month at 12 p.m. ET, join our free live webinars to learn about various aspects of living with Parkinson's disease and our work to speed medical breakthroughs. ?Upcoming Webinar:  Brainwaves:  The Potential of Focused Ultrasound and Deep Brain Stimulation in PD.  Thursday, April 20th at  12 noon. ?Check out additional information on their website to see their full online offerings ? ?[place]:  [email or web address] ?Upcoming Webinar:   Stay tuned ?Webinar Series:  Living with Parkinson's Meetup.   Third Thursdays each month, 3 pm ?Care Partner Monthly Meetup.  With Robin Searing Phinney.  First Tuesday of each month, 2 pm ?Check out additional information to Live Well Today on their website ? ?Parkinson and Movement Disorders (PMD) Alliance:  www.pmdalliance.org ?NeuroLife Online:  Online Education Events ?Sign up for emails, which are sent weekly to give you updates on programming and online offerings ? ?Parkinson's Association of the [place]:  www.parkinsonassociation.org ?Information on online support groups, education events, and online exercises including Yoga, Parkinson's exercises and more-LOTS of information on links to PD resources and online events ?Virtual Support Group through [employer] of the [place]; next one is scheduled for Wednesday, April 5th at 2 pm. (These are typically  scheduled for the 1st Wednesday of the month at 2 pm).  Visit website for  details. ?MOVEMENT AND EXERCISE OPPORTUNITIES ?Parkinson's DRUMMING Classes/Music Therapy with Doylene Canning:  This is a returning class and it's FREE!  2nd Mondays, continuing April 10th  , 11:00 at the [place].  Contact *Misty Taylor-Paladino at [email or web address] or Doylene Canning at [phone] or [email or web address]  ?PWR! Moves Classes at [place].  Wednesdays 10 and 11 am.   Contact Amy Marriott, PT [email or web address] if interested. ?NEW PWR! Moves Class offering at [employer].  Wednesdays 1-2 pm, starting April 12th.  Contact Bryson Dames, Acupuncturist at [employer].  Manuela [email or web address] ?Here is a link to the PWR!Moves classes on Zoom from [place] - Daily Mon-Sat at 10:00. Via Zoom, FREE and open to all.  There is also a link below via Facebook if you use that platform. ? ?[email or web address].si ?[email or web address]://[email or web address].no ? ?Parkinson's Wellness Recovery (PWR! Moves)  www.pwr4life.org ?Info on the PWR! Virtual Experience:  You will have access to our expertise through self-assessment, guided plans that start with the PD-specific fundamentals, educational content, tips, Q&A with an expert, and a growing Art therapist of PD-specific pre-recorded and live exercise classes of varying types and intensity - both physical and cognitive! If that is not enough, we offer 1:1 wellness consultations (in-person or virtual) to personalize your PWR! Research scientist (medical).  ?[employer] Fridays:  ?As part of the PD Health @ Home program, this free video series focuses each week on one aspect of fitness designed to support people living with  Parkinson's.  These weekly videos highlight the [place] recent fitness guidelines for people with Parkinson's disease. ?www.KVTVnet.com.cy ?Dance for PD website is offering free, live-stream classes throughout the week, as well as links to [employer] of classes:  https://danceforparkinsons.org/ ?Dance for Parkinson's in-person class.  February 1-April 26, Wednesdays 4-5 pm.  Free class for people with Parkinson's disease, at [address], [place], [place].  Contact [phone] or [email or web address] to register ?Virtual dance and Pilates for Parkinson's classes: Click on the Community Tab> Parkinson's Movement Initiative Tab.  To register for classes and for more information, visit www.SeekAlumni.co.za and click the ?community? tab.  ?[employer] Parkinson's Cycling Classes  ?[employer]:  Thursdays @ Noon-Live classes at [employer] (Health Net at [place].[email or web address] .[email or web address] or [phone]) ?[facility] [employer] [employer]: Virtual Classes Mondays and Thursdays Jeanette Caprice classes Tuesday, Wednesday and Thursday (contact Isabela at [email or web address]  or [phone]) ?[employer] ?Varied levels of classes are offered Mondays, Tuesdays and Thursdays at [employer].  ?Stretching with Verdis Frederickson weekly class is also offered for people with Parkinson's ?To observe a class or for more information, call [phone] or email Hezzie Bump at [email or web address] .[email or web address] ?ADDITIONAL SUPPORT AND RESOURCES ?Well-Spring Solutions:Online Caregiver Education Opportunities:  www.well-springsolutions.org/caregiver-education/caregiver-support-group.  You may also contact Vickki Muff at [email or web address] or [phone].    ?Well-Spring Navigator:  Just1Navigator program, a free service to help individuals and families through the journey of determining care for older adults.  The ?Navigator? is a [phone],  Education officer, museum, who will speak with a prospective client and/or loved ones to provide an assessment of the situation and a set of recommendations for a personalized care plan -- all free of charge, and whether [facility]

## 2022-01-25 ENCOUNTER — Ambulatory Visit
Admission: RE | Admit: 2022-01-25 | Discharge: 2022-01-25 | Disposition: A | Discharge status: Home / Self Care | Payer: Medicare HMO | Source: Ambulatory Visit | Attending: Family Medicine | Admitting: Family Medicine

## 2022-01-25 DIAGNOSIS — Z1231 Encounter for screening mammogram for malignant neoplasm of breast: Secondary | ICD-10-CM

## 2022-02-12 ENCOUNTER — Other Ambulatory Visit: Payer: Self-pay | Admitting: Neurology

## 2022-02-12 DIAGNOSIS — G20 Parkinson's disease: Secondary | ICD-10-CM

## 2022-04-30 DIAGNOSIS — H35033 Hypertensive retinopathy, bilateral: Secondary | ICD-10-CM | POA: Diagnosis not present

## 2022-04-30 DIAGNOSIS — Z961 Presence of intraocular lens: Secondary | ICD-10-CM | POA: Diagnosis not present

## 2022-04-30 DIAGNOSIS — H04123 Dry eye syndrome of bilateral lacrimal glands: Secondary | ICD-10-CM | POA: Diagnosis not present

## 2022-04-30 DIAGNOSIS — H43812 Vitreous degeneration, left eye: Secondary | ICD-10-CM | POA: Diagnosis not present

## 2022-05-18 ENCOUNTER — Other Ambulatory Visit: Payer: Self-pay | Admitting: Neurology

## 2022-05-18 DIAGNOSIS — G20 Parkinson's disease: Secondary | ICD-10-CM

## 2022-05-21 ENCOUNTER — Other Ambulatory Visit: Payer: Self-pay

## 2022-05-21 DIAGNOSIS — G20 Parkinson's disease: Secondary | ICD-10-CM

## 2022-05-21 MED ORDER — CARBIDOPA-LEVODOPA 25-100 MG PO TABS: 1.0000 | ORAL_TABLET | Freq: Three times a day (TID) | ORAL | 0 refills | Status: DC

## 2022-07-05 DIAGNOSIS — Z0000 Encounter for general adult medical examination without abnormal findings: Secondary | ICD-10-CM | POA: Diagnosis not present

## 2022-07-05 DIAGNOSIS — Z1389 Encounter for screening for other disorder: Secondary | ICD-10-CM | POA: Diagnosis not present

## 2022-07-13 DIAGNOSIS — M8580 Other specified disorders of bone density and structure, unspecified site: Secondary | ICD-10-CM | POA: Diagnosis not present

## 2022-07-13 DIAGNOSIS — G20 Parkinson's disease: Secondary | ICD-10-CM | POA: Diagnosis not present

## 2022-07-13 DIAGNOSIS — E663 Overweight: Secondary | ICD-10-CM | POA: Diagnosis not present

## 2022-07-13 DIAGNOSIS — E7800 Pure hypercholesterolemia, unspecified: Secondary | ICD-10-CM | POA: Diagnosis not present

## 2022-07-13 DIAGNOSIS — I10 Essential (primary) hypertension: Secondary | ICD-10-CM | POA: Diagnosis not present

## 2022-07-13 DIAGNOSIS — Z23 Encounter for immunization: Secondary | ICD-10-CM | POA: Diagnosis not present

## 2022-07-24 NOTE — Progress Notes (Unsigned)
    Assessment/Plan:   1.  Parkinsons Disease, diagnosed October, 2022  -Continue carbidopa/levodopa 25/100, 1 tablet 3 times per day   2.  Cervical dystonia  -this really resolved with tx for Parkinsons Disease.  Wasn't previously bothersome to pt  Subjective:   Sharon Campos was seen today in follow up for Parkinsons disease, diagnosed last visit.  My previous records were reviewed prior to todays visit as well as outside records available to me.  Daughter supplements hx. Pt denies falls.  Pt denies lightheadedness, near syncope.  No hallucinations.  Mood has been good.  Current prescribed movement disorder medications: Carbidopa/levodopa 25/100, 1 tablet 3 times per day     ALLERGIES:   Allergies  Allergen Reactions   Carbamazepine Other (See Comments)   Ceclor [Cefaclor]    Codeine    Darvon [Propoxyphene]    Neosporin [Neomycin-Polymyxin-Gramicidin]     CURRENT MEDICATIONS:  No outpatient medications have been marked as taking for the 07/26/22 encounter (Appointment) with Tiea Manninen, Eustace Quail, DO.     Objective:   PHYSICAL EXAMINATION:    VITALS:   There were no vitals filed for this visit.   GEN:  The patient appears stated age and is in NAD. HEENT:  Normocephalic, atraumatic.  The mucous membranes are moist. The superficial temporal arteries are without ropiness or tenderness. CV:  RRR Lungs:  CTAB Neck/HEME:  There are no carotid bruits bilaterally.  Neurological examination:  Orientation: The patient is alert and oriented x3. Cranial nerves: There is good facial symmetry without facial hypomimia. The speech is fluent and clear. Soft palate rises symmetrically and there is no tongue deviation. Hearing is intact to conversational tone. Sensation: Sensation is intact to light touch throughout Motor: Strength is at least antigravity x4.  Movement examination: Tone: There is normal tone in the RUE Abnormal movements: none today Coordination:  There is no  decremation, with any form of RAMS, including alternating supination and pronation of the forearm, hand opening and closing, finger taps, heel taps and toe taps. Gait and Station: The patient has no difficulty arising out of a deep-seated chair without the use of the hands. The patient's stride length is good but she has just a tiny bit of thumb tremor with ambulation on the right.    Total time spent on today's visit was *** minutes, including both face-to-face time and nonface-to-face time.  Time included that spent on review of records (prior notes available to me/labs/imaging if pertinent), discussing treatment and goals, answering patient's questions and coordinating care.   Cc:  Kathyrn Lass, MD

## 2022-07-26 ENCOUNTER — Ambulatory Visit: Payer: Medicare HMO | Admitting: Neurology

## 2022-07-26 ENCOUNTER — Encounter: Payer: Self-pay | Admitting: Neurology

## 2022-07-26 VITALS — BP 126/82 | HR 63 | Ht 62.0 in | Wt 158.0 lb

## 2022-07-26 DIAGNOSIS — G20A1 Parkinson's disease without dyskinesia, without mention of fluctuations: Secondary | ICD-10-CM

## 2022-07-26 NOTE — Patient Instructions (Signed)
Local and Online Resources for Power over Parkinson's Group September 2023  LOCAL [place] PARKINSON'S GROUPS  Power over Parkinson's Group:   Power Over Parkinson's Patient Education Group will be Wednesday, September 13th-*Hybrid meting*- in person at [facility] location and via [facility] at 2 pm.   Upcoming Power over [employer] Meetings:  2nd Wednesdays of the month at 2 pm:  September 13th, October 11th, November 8th Contact Amy Marriott at [email or web address] if interested in participating in this group Parkinson's Care Partners Group:    3rd Mondays, Contact Misty Paladino Atypical Parkinsonian Patient Group:   4th Wednesdays, [place] If you are interested in participating in these groups with Misty, please contact her directly for how to join those meetings.  Her contact information is [email or web address].    LOCAL EVENTS AND NEW OFFERINGS New PWR! Moves Dynegy Instructor-Led Classes offering at [employer]!  Wednesdays 1-2 pm.   Contact Vonna Kotyk at  [email or web address] .[email or web address] or Caron Presume at [place], [email or web address] .[email or web address] Dance for Parkinson 's classes will be on Tuesdays 9:30am-10:30am starting October 3-December 12 with a break the week of November 21 . Located in the [employer]  which is in the first floor of the [employer] ([place] for Parkinson's will be held on 2nd and 4th Mondays at 11:00am . First class will start  September 25th.  Located at the [place] ([place].) Through support from the [place] and [place] for Parkinson's classes are free for both patients and caregivers.  Contact Misty Taylor-Paladino for more details about registering.  [place]:  www.[email or web address] PD Health at Home continues:  Mindfulness Mondays, Wellness  Wednesdays, Fitness Fridays  Upcoming Education:   Navigating Nutrition with PD.  Wednesday, Sept. 6th 1:00-2:00 pm Understanding Mind and Memory.  Wednesday, Sept. 20th 1:00-2:00 pm  Expert Briefing:    Parkinson's Disease and the Bladder.  Wednesday, Sept. 13th 1:00-2:00 pm Parkinson's and the Gut-Brain Connection.  Wednesday, Oct. 11th 1:00-2:00 pm Register for expert briefings (webinars) at [email or web address].si Please check out their website to sign up for emails and see their full online offerings   [place]:  www.[email or web address]  Third Thursday Webinars:  On the third Thursday of every month at 12 p.m. ET, join our free live webinars to learn about various aspects of living with Parkinson's disease and our work to speed medical breakthroughs. Upcoming Webinar:  Stay tuned Check out additional information on their website to see their full online offerings  [employer]:  www.[email or web address] Upcoming Webinar:   Stay tuned Webinar Series:  Living with Parkinson's Meetup.   Third Thursdays each month, 3 pm Care Partner Monthly Meetup.  With Robin Searing Phinney.  First Tuesday of each month, 2 pm Check out additional information to Live Well Today on their website  Parkinson and Movement Disorders (PMD) Alliance:  www.pmdalliance.org NeuroLife Online:  Online Education Events Sign up for emails, which are sent weekly to give you updates on programming and online offerings  Parkinson's Association of the [place]:  www.parkinsonassociation.org Information on online support groups, education events, and online exercises including Yoga, Parkinson's exercises and more-LOTS of information on links to PD resources and online events Virtual Support Group through Parkinson's Association of the [place]; next one is scheduled for Wednesday, October 4th at 2 pm. (No September meeting  due to the symposium.  These are typically scheduled for the 1st Wednesday  of the month at 2 pm).  Visit website for details. Register for "Caring for Parkinson's-Caring for You", 9th Annual Symposium.  In-person event in [place].  September 9th.  To register:  www.[email or web address].org/symposium-registration/?blm_aid=[email or web address] MOVEMENT AND EXERCISE OPPORTUNITIES PWR! Moves Classes at [place].  Wednesdays 10 and 11 am.   Contact Amy Gerrit Friends, PT [email or web address] if interested. NEW PWR! Moves Class offerings at [employer].  Wednesdays 1-2 pm.  Contact Vonna Kotyk at  [email or web address] .[email or web address] or Caron Presume at [place],  [email or web address] Parkinson's Wellness Recovery (PWR! Moves)  www.pwr4life.org Info on the PWR! Virtual Experience:  You will have access to our expertise through self-assessment, guided plans that start with the PD-specific fundamentals, educational content, tips, Q&A with an expert, and a growing Art therapist of PD-specific pre-recorded and live exercise classes of varying types and intensity - both physical and cognitive! If that is not enough, we offer 1:1 wellness consultations (in-person or virtual) to personalize your PWR! Research scientist (medical).  [place] Fridays:  As part of the PD Health @ Home program, this free video series focuses each week on one aspect of fitness designed to support people living with Parkinson's.  These weekly videos highlight the [place] recent fitness guidelines for people with Parkinson's disease. ModemGamers.si Dance for PD website is offering free, live-stream classes throughout the week, as well as links to [employer] of classes:  https://danceforparkinsons.org/ Virtual dance and Pilates for Parkinson's classes: Click on the Community Tab> Parkinson's Movement Initiative Tab.  To register for classes and for more  information, visit www.SeekAlumni.co.za and click the "community" tab.  YM[employer] Parkinson's Cycling Classes  [employer]:  Thursdays @ Noon-Live classes at [employer] (Health Net at [place].[email or web address] .[email or web address] or [phone]) [facility] [employer]: Virtual Classes Mondays and Thursdays Jeanette Caprice classes Tuesday, Wednesday and Thursday (contact [facility] at [email or web address]  or [phone]) [place] Varied levels of classes are offered Tuesdays and Thursdays at [facility].  Stretching with Verdis Frederickson weekly class is also offered for people with Parkinson's To observe a class or for more information, call [phone] or email Hezzie Bump at [email or web address] .[email or web address] ADDITIONAL SUPPORT AND RESOURCES Well-Spring Solutions:Online Caregiver Education Opportunities:  www.well-springsolutions.org/caregiver-education/caregiver-support-group.  You may also contact Vickki Muff at [email or web address] or [phone].    Coping with Difficult Caregiver Emotions.  Wednesday, September 20th, 10:30 am-12.  The [facility] Navigating the Maze of Senior Care Options.  Thursday, September 28th, 4-5:15 pm.  The [facility]. Well-Spring Navigator:  [facility] program, a free service to help individuals and families through the journey of determining care for older adults.  The "Navigator" is a [employer], Education officer, museum, who will speak with a prospective client and/or loved ones to provide an assessment of the situation and a set of recommendations for a personalized care plan -- all free of charge, and whether Well-Spring Solutions offers the needed service or not. If the need is not a service we provide, we are well-connected with reputable programs in town that we can refer you to.  www.well-springsolutions.org or to speak with the Navigator, call [phone].

## 2022-08-22 ENCOUNTER — Other Ambulatory Visit: Payer: Self-pay | Admitting: Neurology

## 2022-08-22 DIAGNOSIS — G20A1 Parkinson's disease without dyskinesia, without mention of fluctuations: Secondary | ICD-10-CM

## 2022-10-24 DIAGNOSIS — M720 Palmar fascial fibromatosis [Dupuytren]: Secondary | ICD-10-CM | POA: Diagnosis not present

## 2022-10-24 DIAGNOSIS — M65331 Trigger finger, right middle finger: Secondary | ICD-10-CM | POA: Diagnosis not present

## 2022-10-24 DIAGNOSIS — M65332 Trigger finger, left middle finger: Secondary | ICD-10-CM | POA: Diagnosis not present

## 2022-10-29 DIAGNOSIS — D485 Neoplasm of uncertain behavior of skin: Secondary | ICD-10-CM | POA: Diagnosis not present

## 2022-10-29 DIAGNOSIS — D1801 Hemangioma of skin and subcutaneous tissue: Secondary | ICD-10-CM | POA: Diagnosis not present

## 2022-10-29 DIAGNOSIS — D2339 Other benign neoplasm of skin of other parts of face: Secondary | ICD-10-CM | POA: Diagnosis not present

## 2022-10-29 DIAGNOSIS — D2272 Melanocytic nevi of left lower limb, including hip: Secondary | ICD-10-CM | POA: Diagnosis not present

## 2022-10-29 DIAGNOSIS — D224 Melanocytic nevi of scalp and neck: Secondary | ICD-10-CM | POA: Diagnosis not present

## 2022-10-29 DIAGNOSIS — L820 Inflamed seborrheic keratosis: Secondary | ICD-10-CM | POA: Diagnosis not present

## 2022-10-29 DIAGNOSIS — D692 Other nonthrombocytopenic purpura: Secondary | ICD-10-CM | POA: Diagnosis not present

## 2022-10-29 DIAGNOSIS — D225 Melanocytic nevi of trunk: Secondary | ICD-10-CM | POA: Diagnosis not present

## 2022-10-29 DIAGNOSIS — D2239 Melanocytic nevi of other parts of face: Secondary | ICD-10-CM | POA: Diagnosis not present

## 2022-10-29 DIAGNOSIS — D2271 Melanocytic nevi of right lower limb, including hip: Secondary | ICD-10-CM | POA: Diagnosis not present

## 2022-11-08 DIAGNOSIS — J019 Acute sinusitis, unspecified: Secondary | ICD-10-CM | POA: Diagnosis not present

## 2022-11-08 DIAGNOSIS — H1013 Acute atopic conjunctivitis, bilateral: Secondary | ICD-10-CM | POA: Diagnosis not present

## 2022-11-08 DIAGNOSIS — Z6827 Body mass index (BMI) 27.0-27.9, adult: Secondary | ICD-10-CM | POA: Diagnosis not present

## 2022-11-21 DIAGNOSIS — M65332 Trigger finger, left middle finger: Secondary | ICD-10-CM | POA: Diagnosis not present

## 2022-11-21 DIAGNOSIS — M65331 Trigger finger, right middle finger: Secondary | ICD-10-CM | POA: Diagnosis not present

## 2022-12-17 ENCOUNTER — Other Ambulatory Visit: Payer: Self-pay | Admitting: Family Medicine

## 2022-12-17 DIAGNOSIS — Z1231 Encounter for screening mammogram for malignant neoplasm of breast: Secondary | ICD-10-CM

## 2023-01-11 DIAGNOSIS — Z1211 Encounter for screening for malignant neoplasm of colon: Secondary | ICD-10-CM | POA: Diagnosis not present

## 2023-01-11 DIAGNOSIS — E7800 Pure hypercholesterolemia, unspecified: Secondary | ICD-10-CM | POA: Diagnosis not present

## 2023-01-11 DIAGNOSIS — M8580 Other specified disorders of bone density and structure, unspecified site: Secondary | ICD-10-CM | POA: Diagnosis not present

## 2023-01-11 DIAGNOSIS — Z6828 Body mass index (BMI) 28.0-28.9, adult: Secondary | ICD-10-CM | POA: Diagnosis not present

## 2023-01-11 DIAGNOSIS — I10 Essential (primary) hypertension: Secondary | ICD-10-CM | POA: Diagnosis not present

## 2023-01-11 DIAGNOSIS — E663 Overweight: Secondary | ICD-10-CM | POA: Diagnosis not present

## 2023-01-11 DIAGNOSIS — G20A1 Parkinson's disease without dyskinesia, without mention of fluctuations: Secondary | ICD-10-CM | POA: Diagnosis not present

## 2023-01-11 DIAGNOSIS — I471 Supraventricular tachycardia, unspecified: Secondary | ICD-10-CM | POA: Diagnosis not present

## 2023-01-11 DIAGNOSIS — H9113 Presbycusis, bilateral: Secondary | ICD-10-CM | POA: Diagnosis not present

## 2023-01-16 ENCOUNTER — Other Ambulatory Visit: Payer: Self-pay | Admitting: Family Medicine

## 2023-01-16 DIAGNOSIS — M8580 Other specified disorders of bone density and structure, unspecified site: Secondary | ICD-10-CM

## 2023-01-25 ENCOUNTER — Ambulatory Visit: Payer: Medicare HMO | Admitting: Neurology

## 2023-01-30 ENCOUNTER — Ambulatory Visit
Admission: RE | Admit: 2023-01-30 | Discharge: 2023-01-30 | Disposition: A | Discharge status: Home / Self Care | Payer: Medicare HMO | Source: Ambulatory Visit | Attending: Family Medicine | Admitting: Family Medicine

## 2023-01-30 DIAGNOSIS — Z1231 Encounter for screening mammogram for malignant neoplasm of breast: Secondary | ICD-10-CM

## 2023-01-31 DIAGNOSIS — Z1211 Encounter for screening for malignant neoplasm of colon: Secondary | ICD-10-CM | POA: Diagnosis not present

## 2023-02-11 NOTE — Progress Notes (Unsigned)
Assessment/Plan:   1.  Parkinsons Disease, diagnosed October, 2022  -Continue carbidopa/levodopa 25/100, 1 tablet 3 times per day.  Just a tad underdosed but she feels good so may change med in future.    -We discussed that it used to be thought that levodopa would increase risk of melanoma but now it is believed that Parkinsons itself likely increases risk of melanoma. she is to get regular skin checks.  She does that.  -she is involved with Parkinsons Disease GeneRation study  -she is doing great with exercise!   2.  Cervical dystonia  -mild and not bothersome to patient  3.  Min dysphagia  -we discussed MBE but not ready for it.  4.  HTN with dizziness when standing in church  -on metoprolol and amlodipine.    -discussed concept of permissive HTN.  She notes its been in the low 100's at times at home and dizzy when standing at church.  Pt to make appt with pcp to discuss need for both meds or if they can be decreased in dosage.  Subjective:   Maleiyah Releford was seen today in follow up for Parkinsons disease.  My previous records were reviewed prior to todays visit as well as outside records available to me.  Daughter supplements hx. patient is doing well in terms of her Parkinson's.  She continues to exercise.  Takes her levodopa faithfully - rarely misses them.  Goes to gym 6 days per week - cardio 3 days; weights 3 days and yoga 1 day.  Has no side effects.  No near syncope.  Getting dizzy when standing up in church.   She is focusing on hydration.    No hallucinations.  Current prescribed movement disorder medications: Carbidopa/levodopa 25/100, 1 tablet 3 times per day     ALLERGIES:   Allergies  Allergen Reactions   Carbamazepine Other (See Comments)   Ceclor [Cefaclor]    Codeine    Darvon [Propoxyphene]    Neosporin [Neomycin-Polymyxin-Gramicidin]     CURRENT MEDICATIONS:  Current Meds  Medication Sig   alendronate (FOSAMAX) 70 MG tablet Take 70 mg by  mouth once a week. Take with a full glass of water on an empty stomach.   amLODipine (NORVASC) 10 MG tablet    atorvastatin (LIPITOR) 40 MG tablet Take 40 mg by mouth daily.   calcium citrate-vitamin D (CITRACAL+D) 315-200 MG-UNIT tablet Take 1 tablet by mouth 2 (two) times daily.   carbidopa-levodopa (SINEMET IR) 25-100 MG tablet TAKE ONE TABLET BY MOUTH THREE TIMES DAILY   diclofenac Sodium (VOLTAREN) 1 % GEL Apply topically 4 (four) times daily.   enalapril (VASOTEC) 10 MG tablet Take 10 mg by mouth daily.   Ketotifen Fumarate (REFRESH EYE ITCH RELIEF OP) Apply to eye as needed.   metoprolol tartrate (LOPRESSOR) 25 MG tablet Take 25 mg by mouth 2 (two) times daily.   Multiple Vitamin (MULTIVITAMIN) capsule Take 1 capsule by mouth daily.   naproxen (NAPROSYN) 250 MG tablet Take by mouth as needed.   Omega-3 Fatty Acids (OMEGA-3 FISH OIL) 1200 MG CAPS Take by mouth.     Objective:   PHYSICAL EXAMINATION:    VITALS:   Vitals:   02/13/23 0921  BP: 122/70  Pulse: 99  SpO2: 99%  Weight: 158 lb 9.6 oz (71.9 kg)  Height:  (1.575 m)     GEN:  The patient appears stated age and is in NAD. HEENT:  Normocephalic, atraumatic.  The mucous membranes are  moist. The superficial temporal arteries are without ropiness or tenderness. CV:  RRR Lungs:  CTAB Neck/HEME:  There are no carotid bruits bilaterally.  Head is SB to the right  Neurological examination:  Orientation: The patient is alert and oriented x3. Cranial nerves: There is good facial symmetry without facial hypomimia. The speech is fluent and clear. Soft palate rises symmetrically and there is no tongue deviation. Hearing is intact to conversational tone. Sensation: Sensation is intact to light touch throughout Motor: Strength is at least antigravity x4.  Movement examination: Tone: There is mild increased tone in the RUE Abnormal movements: rare tremor in the R hand Coordination:  There is no decremation, with any form  of RAMS, including alternating supination and pronation of the forearm, hand opening and closing, finger taps, heel taps and toe taps. Gait and Station: The patient has no difficulty arising out of a deep-seated chair without the use of the hands. The patient's stride length is slightly decreased with decreased arm swing bilaterally   Total time spent on today's visit was 33 minutes, including both face-to-face time and nonface-to-face time.  Time included that spent on review of records (prior notes available to me/labs/imaging if pertinent), discussing treatment and goals, answering patient's questions and coordinating care.  History  Cc:  Sigmund Hazel, MD

## 2023-02-13 ENCOUNTER — Ambulatory Visit: Payer: Medicare HMO | Admitting: Neurology

## 2023-02-13 ENCOUNTER — Encounter: Payer: Self-pay | Admitting: Neurology

## 2023-02-13 VITALS — BP 122/70 | HR 99 | Ht 62.0 in | Wt 158.6 lb

## 2023-02-13 DIAGNOSIS — R42 Dizziness and giddiness: Secondary | ICD-10-CM

## 2023-02-13 DIAGNOSIS — G20A1 Parkinson's disease without dyskinesia, without mention of fluctuations: Secondary | ICD-10-CM

## 2023-02-13 DIAGNOSIS — G243 Spasmodic torticollis: Secondary | ICD-10-CM | POA: Diagnosis not present

## 2023-02-13 NOTE — Patient Instructions (Signed)
You look great!  I'm so proud of you!  Local and Online Resources for Power over Parkinson's Group  April 2024   LOCAL [place] PARKINSON'S GROUPS   Power over Parkinson's Group:    Power Over Parkinson's Patient Education Group will be Wednesday, April 10th-*Hybrid meting*- in person at [facility] location and via [facility], 2:00-3:00 pm.   Power over [employer] and Care Partner Groups will meet together, with plans for separate break out session for caregivers, depending on topic/speaker Upcoming Power over Parkinson's Meetings/Care Partner Support:  2nd Wednesdays of the month at 2 pm:   April 10th, May 8th Contact Amy Marriott at [email or web address] if interested in participating in this group    LOCAL EVENTS AND NEW OFFERINGS  NEW:  Parkinson's Social Game Night.  First Thursday of each month, 2:00-4:00 pm.  *Next date is April 4th*.  Rossie Muskrat [employer], [employer].  Contact [email or web address] if interested. Parkinson's CarePartner Group for Men is in the works, if interested email [email or web address] ACT FITNESS Chair Yoga classes "Train and Gain", Fridays 10 am, [employer].  Contact Gina at [phone].  Health visitor Classes offering at [employer]!  TUESDAYS (Chair Yoga)  and Wednesdays (PWR! Moves)  1:00 pm.   Contact Synetta Shadow at  [email or web address]  or [phone]  Drumming for Parkinson's will be held on 2nd and 4th Mondays at 11:00 am.   Located at the [place] of the [employer] ([address] [place].)  Contact Albertina Parr at [email or web address] or [phone]  Dance for Parkinson 's classes will be on Tuesdays 10-11 am. Located in the [employer], in the first floor of the [employer] ([address]) To register:  [email or web address] or [phone] [facility] Parkinson's Tai Chi Class, Mondays  at 11 am.  Call [phone] for details Hamil-Kerr Challenge.  Bike, Run, [employer] for [employer].  Saturday, April 6th at [facility].  To register, visit [email or web address] Moving Day [facility].  Saturday, May 4th, 10 am start.  Register at [email or web address]    ONLINE EDUCATION AND SUPPORT  Parkinson Foundation:  www.parkinson.org  PD Health at Home continues:  Mindfulness Mondays, Wellness Wednesdays, Fitness Fridays  (PWR! Moves as part of Fitness Fridays March 22nd, 1-1:45 pm) Upcoming Education:   Parkinson's 101.  Wednesday, April 3rd, 1-2 pm Movement for Parkinson's.  Wednesday, May 1st, 1-2 pm Expert Briefing:  Research Update:  Working to [employer] PD.  Wednesday, April 10th, 1-2 pm Trouble with Zzz's:  Sleep Challenges with Parkinson's.  Wed, May 8th 1-2 pm Register for virtual education and expert briefings (webinars) at [email or web address].gl Please check out their website to sign up for emails and see their full online offerings     [employer] Foundation:  www.[email or web address]   Third Thursday Webinars:  On the third Thursday of every month at 12 p.m. ET, join our free live webinars to learn about various aspects of living with Parkinson's disease and our work to speed medical breakthroughs.  Upcoming Webinar:  Let's Talk Taboos:  Hard-to-Discuss Parkinson's Symptoms.  Thursday, April 18th at 12 noon. Check out additional information on their website to see their full online offerings    [facility]:  [email or web address]  Upcoming Webinar:   Emergent [employer]rapies.  Wednesday, April 2nd, 4 pm Series:  Living with Parkinson's Meetup.   Third Thursdays each month, 3 pm  Care Partner Monthly Meetup.  With Jillene Bucks [place].  First  Tuesday of each month, 2 pm  Check out additional information to Live Well Today on their website    Parkinson and Movement Disorders (PMD) Alliance:   www.pmdalliance.org  [employer] Online:  Online Education Events  Sign up for emails, which are sent weekly to give you updates on programming and online offerings    Parkinson's Association of the [place]:  www.[email or web address]  Information on online support groups, education events, and online exercises including Yoga, Parkinson's exercises and more-LOTS of information on links to PD resources and online events  Virtual Support Group through Parkinson's Association of the [place]; next one is scheduled for Wednesday, April 3rd  MOVEMENT AND EXERCISE OPPORTUNITIES  PWR! Moves Classes at [facility] Exercise Room.  Wednesdays 10 and 11 am.   Contact Amy Marriott, PT [email or web address] if interested.  Parkinson's Exercise Class offerings at [employer]. *TUESDAYS* (Chair yoga) and Wednesdays (PWR! Moves)  1:00 pm.    Contact Synetta Shadow at [email or web address]    Parkinson's Wellness Recovery (PWR! Moves)  www.[email or web address]  Info on the PWR! Virtual Experience:  You will have access to our expertise?through self-assessment, guided plans that start with the PD-specific fundamentals, educational content, tips, Q&A with an expert, and a growing Engineering geologist of PD-specific pre-recorded and live exercise classes of varying types and intensity - both physical and cognitive! If that is not enough, we offer 1:1 wellness consultations (in-person or virtual) to personalize your PWR! Dance movement psychotherapist.   Parkinson [employer] Fridays:   As part of the PD Health @ Home program, this free video series focuses each week on one aspect of fitness designed to support people living with Parkinson's.? These weekly videos highlight the Parkinson Foundation fitness guidelines for people with Parkinson's disease.  MenusLocal.com.[email or web address]  Dance for PD website is offering free, live-stream classes throughout the week, as well as links to  [employer] of classes:  [email or web address]  Virtual dance and Pilates for Parkinson's classes: Click on the Community Tab> Parkinson's Movement Initiative Tab.  To register for classes and for more information, visit www.[email or web address].co.za and click the "community" tab.   [employer] Parkinson's Cycling Classes   [employer]:  Thursdays @ Noon-Live classes at [employer] ([employer] at [email or web address]?or [phone])  [employer]: Classes Tuesday, Wednesday and Thursday (contact [place] at [email or web address] ?or [phone])  [facility] [employer]  Varied levels of classes are offered Tuesdays and Thursdays at [employer].   Stretching with Byrd Hesselbach weekly class is also offered for people with Parkinson's  To observe a class or for more information, call [phone] or email Patricia Nettle at [email or web address]   ADDITIONAL SUPPORT AND RESOURCES  [employer]:  Online Caregiver Education Opportunities:  www.well-springsolutions.org/caregiver-education/caregiver-support-group.  You may also contact Loleta Chance at [facility] -spring.org or [phone].     [employer] April [employer] Decisions Day:  The Most Critical Legal and Medical Decisions to Consider Now!  Tuesday, April 16th, 1-3 pm at [facility] at [facility].  Contact Loleta Chance at [place] -spring.org or [phone] Powerful Tools for Caregivers.  6 week educational series for caregivers.  April 18-May 23, 10:30 am-12:15 pm at [facility] 3rd Floor Conference Room.   Contact Loleta Chance at [facility] -spring.org or [phone] to register Well-Spring Navigator:  Just1Navigator program, a?free service to help individuals and families through the journey of determining care for older adults.  The "Navigator" is a Child psychotherapist, Sidney Ace, who will speak with a  prospective client and/or loved ones to provide an assessment of the  situation and a set of recommendations for a personalized care plan -- all free of charge, and whether?[employer] offers the needed service or not. If the need is not a service we provide, we are well-connected with reputable programs in town that we can refer you to.  www.well-springsolutions.org or to speak with the Navigator, call [phone].

## 2023-02-27 DIAGNOSIS — M65332 Trigger finger, left middle finger: Secondary | ICD-10-CM | POA: Diagnosis not present

## 2023-03-26 DIAGNOSIS — M65332 Trigger finger, left middle finger: Secondary | ICD-10-CM | POA: Diagnosis not present

## 2023-05-10 DIAGNOSIS — Z961 Presence of intraocular lens: Secondary | ICD-10-CM | POA: Diagnosis not present

## 2023-05-10 DIAGNOSIS — H04123 Dry eye syndrome of bilateral lacrimal glands: Secondary | ICD-10-CM | POA: Diagnosis not present

## 2023-05-10 DIAGNOSIS — H524 Presbyopia: Secondary | ICD-10-CM | POA: Diagnosis not present

## 2023-05-10 DIAGNOSIS — H43812 Vitreous degeneration, left eye: Secondary | ICD-10-CM | POA: Diagnosis not present

## 2023-05-10 DIAGNOSIS — H35033 Hypertensive retinopathy, bilateral: Secondary | ICD-10-CM | POA: Diagnosis not present

## 2023-06-06 ENCOUNTER — Encounter: Payer: Self-pay | Admitting: Neurology

## 2023-07-11 ENCOUNTER — Other Ambulatory Visit: Payer: Self-pay

## 2023-07-11 ENCOUNTER — Telehealth: Payer: Self-pay | Admitting: Neurology

## 2023-07-11 DIAGNOSIS — Z1331 Encounter for screening for depression: Secondary | ICD-10-CM | POA: Diagnosis not present

## 2023-07-11 DIAGNOSIS — Z6827 Body mass index (BMI) 27.0-27.9, adult: Secondary | ICD-10-CM | POA: Diagnosis not present

## 2023-07-11 DIAGNOSIS — Z23 Encounter for immunization: Secondary | ICD-10-CM | POA: Diagnosis not present

## 2023-07-11 DIAGNOSIS — Z0000 Encounter for general adult medical examination without abnormal findings: Secondary | ICD-10-CM | POA: Diagnosis not present

## 2023-07-11 DIAGNOSIS — G20A1 Parkinson's disease without dyskinesia, without mention of fluctuations: Secondary | ICD-10-CM

## 2023-07-11 MED ORDER — CARBIDOPA-LEVODOPA 25-100 MG PO TABS: 1.0000 | ORAL_TABLET | Freq: Three times a day (TID) | ORAL | 0 refills | Status: DC

## 2023-07-11 NOTE — Telephone Encounter (Signed)
RX sent

## 2023-07-11 NOTE — Telephone Encounter (Signed)
[facility] Wanted to see if patients mediation ( Carbido-Levodopa) be sent to [facility]

## 2023-07-12 DIAGNOSIS — M8580 Other specified disorders of bone density and structure, unspecified site: Secondary | ICD-10-CM | POA: Diagnosis not present

## 2023-07-12 DIAGNOSIS — Z6826 Body mass index (BMI) 26.0-26.9, adult: Secondary | ICD-10-CM | POA: Diagnosis not present

## 2023-07-12 DIAGNOSIS — G20A1 Parkinson's disease without dyskinesia, without mention of fluctuations: Secondary | ICD-10-CM | POA: Diagnosis not present

## 2023-07-12 DIAGNOSIS — E7800 Pure hypercholesterolemia, unspecified: Secondary | ICD-10-CM | POA: Diagnosis not present

## 2023-07-12 DIAGNOSIS — I10 Essential (primary) hypertension: Secondary | ICD-10-CM | POA: Diagnosis not present

## 2023-07-24 ENCOUNTER — Inpatient Hospital Stay
Admission: RE | Admit: 2023-07-24 | Discharge: 2023-07-24 | Disposition: A | Discharge status: Home / Self Care | Payer: Medicare HMO | Source: Ambulatory Visit | Attending: Family Medicine | Admitting: Family Medicine

## 2023-07-24 DIAGNOSIS — M8580 Other specified disorders of bone density and structure, unspecified site: Secondary | ICD-10-CM

## 2023-07-24 DIAGNOSIS — E349 Endocrine disorder, unspecified: Secondary | ICD-10-CM | POA: Diagnosis not present

## 2023-07-24 DIAGNOSIS — M8588 Other specified disorders of bone density and structure, other site: Secondary | ICD-10-CM | POA: Diagnosis not present

## 2023-07-24 DIAGNOSIS — N958 Other specified menopausal and perimenopausal disorders: Secondary | ICD-10-CM | POA: Diagnosis not present

## 2023-08-21 NOTE — Progress Notes (Signed)
Assessment/Plan:   1.  Parkinsons Disease, diagnosed October, 2022  -Increase carbidopa/levodopa 25/100, 2 at 7am/1 at 11am/1 at 4pm  -We discussed that it used to be thought that levodopa would increase risk of melanoma but now it is believed that Parkinsons itself likely increases risk of melanoma. she is to get regular skin checks.  She does that.  -she is involved with Parkinsons Disease GeneRation study  -she is doing great with exercise!   2.  Cervical dystonia  -mild and not bothersome to patient  3.  Min dysphagia  -we discussed MBE but not ready for it.  4.  HTN with lowering of BP  -on metoprolol and amlodipine.  Amlodipine decreased since last visit and she feels better but will still need to watch as BP still on low end  -discussed concept of permissive HTN.    5.  Nocturia  -discussed urology referral but its not that bothersome to her yet  Subjective:   Sharon Campos was seen today in follow up for Parkinsons disease.  My previous records were reviewed prior to todays visit as well as outside records available to me.  Daughter supplements hx. last visit, the patient was describing dizziness, especially when standing up in church.  She was on 2 antihypertensives, and I told her to discuss whether or not she needed both of these with her primary care physician.  She reports today that the amlodipine dose was reduced.  Dizziness with standing in church is better.  She has had no falls since last visit.  She has had 2 episodes with being off balance in a turn in small spaces - once in a shower and once in a shed.  She continues to exercise but not quite as much as she went back to work.  No hallucinations.  Some nocturia  Current prescribed movement disorder medications: Carbidopa/levodopa 25/100, 1 tablet 3 times per day     ALLERGIES:   Allergies  Allergen Reactions   Carbamazepine Other (See Comments)   Ceclor [Cefaclor]    Codeine    Darvon [Propoxyphene]     Neosporin [Neomycin-Polymyxin-Gramicidin]     CURRENT MEDICATIONS:  Current Meds  Medication Sig   alendronate (FOSAMAX) 70 MG tablet Take 70 mg by mouth once a week. Take with a full glass of water on an empty stomach.   amLODipine (NORVASC) 10 MG tablet Take 5 mg by mouth daily.   atorvastatin (LIPITOR) 40 MG tablet Take 40 mg by mouth daily.   calcium citrate-vitamin D (CITRACAL+D) 315-200 MG-UNIT tablet Take 1 tablet by mouth 2 (two) times daily.   carbidopa-levodopa (SINEMET IR) 25-100 MG tablet Take 1 tablet by mouth 3 (three) times daily.   diclofenac Sodium (VOLTAREN) 1 % GEL Apply topically 4 (four) times daily.   enalapril (VASOTEC) 10 MG tablet Take 10 mg by mouth daily.   Ketotifen Fumarate (REFRESH EYE ITCH RELIEF OP) Apply to eye as needed.   metoprolol tartrate (LOPRESSOR) 25 MG tablet Take 25 mg by mouth 2 (two) times daily.   Multiple Vitamin (MULTIVITAMIN) capsule Take 1 capsule by mouth daily.   naproxen (NAPROSYN) 250 MG tablet Take by mouth as needed.   Omega-3 Fatty Acids (OMEGA-3 FISH OIL) 1200 MG CAPS Take by mouth.     Objective:   PHYSICAL EXAMINATION:    VITALS:   Vitals:   08/26/23 0839  BP: 118/78  Pulse: 61  SpO2: 96%  Weight: 150 lb 6.4 oz (68.2 kg)  Height:  5\' 2"  (1.575 m)    GEN:  The patient appears stated age and is in NAD. HEENT:  Normocephalic, atraumatic.  The mucous membranes are moist. The superficial temporal arteries are without ropiness or tenderness. CV:  RRR Lungs:  CTAB Neck/HEME:  There are no carotid bruits bilaterally.  Head is SB to the right  Neurological examination:  Orientation: The patient is alert and oriented x3. Cranial nerves: There is good facial symmetry without facial hypomimia. The speech is fluent and clear. Soft palate rises symmetrically and there is no tongue deviation. Hearing is intact to conversational tone. Sensation: Sensation is intact to light touch throughout Motor: Strength is at least  antigravity x4.  Movement examination: Tone: There is mild to mod increased tone in the RUE Abnormal movements: rare tremor in the R thumb Coordination:  There is no decremation, with any form of RAMS, including alternating supination and pronation of the forearm, hand opening and closing, finger taps, heel taps and toe taps. Gait and Station: The patient has no difficulty arising out of a deep-seated chair without the use of the hands. The patient's stride length is slightly decreased with decreased arm swing bilaterally (similar to last visit)   Total time spent on today's visit was 30 minutes, including both face-to-face time and nonface-to-face time.  Time included that spent on review of records (prior notes available to me/labs/imaging if pertinent), discussing treatment and goals, answering patient's questions and coordinating care.    Cc:  Sigmund Hazel, MD

## 2023-08-26 ENCOUNTER — Ambulatory Visit: Payer: Medicare HMO | Admitting: Neurology

## 2023-08-26 ENCOUNTER — Encounter: Payer: Self-pay | Admitting: Neurology

## 2023-08-26 VITALS — BP 118/78 | HR 61 | Ht 62.0 in | Wt 150.4 lb

## 2023-08-26 DIAGNOSIS — I959 Hypotension, unspecified: Secondary | ICD-10-CM | POA: Diagnosis not present

## 2023-08-26 DIAGNOSIS — G20A1 Parkinson's disease without dyskinesia, without mention of fluctuations: Secondary | ICD-10-CM

## 2023-08-26 DIAGNOSIS — R351 Nocturia: Secondary | ICD-10-CM | POA: Diagnosis not present

## 2023-08-26 MED ORDER — CARBIDOPA-LEVODOPA 25-100 MG PO TABS: ORAL_TABLET | ORAL | 1 refills | Status: DC

## 2023-08-29 ENCOUNTER — Ambulatory Visit: Payer: Medicare HMO | Admitting: Neurology

## 2023-10-31 DIAGNOSIS — L570 Actinic keratosis: Secondary | ICD-10-CM | POA: Diagnosis not present

## 2023-10-31 DIAGNOSIS — D225 Melanocytic nevi of trunk: Secondary | ICD-10-CM | POA: Diagnosis not present

## 2023-10-31 DIAGNOSIS — L821 Other seborrheic keratosis: Secondary | ICD-10-CM | POA: Diagnosis not present

## 2023-10-31 DIAGNOSIS — D2239 Melanocytic nevi of other parts of face: Secondary | ICD-10-CM | POA: Diagnosis not present

## 2023-10-31 DIAGNOSIS — D224 Melanocytic nevi of scalp and neck: Secondary | ICD-10-CM | POA: Diagnosis not present

## 2023-11-27 DIAGNOSIS — M65332 Trigger finger, left middle finger: Secondary | ICD-10-CM | POA: Diagnosis not present

## 2023-12-25 ENCOUNTER — Other Ambulatory Visit: Payer: Self-pay | Admitting: Family Medicine

## 2023-12-25 DIAGNOSIS — Z1231 Encounter for screening mammogram for malignant neoplasm of breast: Secondary | ICD-10-CM

## 2023-12-31 DIAGNOSIS — M65332 Trigger finger, left middle finger: Secondary | ICD-10-CM | POA: Diagnosis not present

## 2024-01-10 DIAGNOSIS — M65332 Trigger finger, left middle finger: Secondary | ICD-10-CM | POA: Diagnosis not present

## 2024-01-14 DIAGNOSIS — E663 Overweight: Secondary | ICD-10-CM | POA: Diagnosis not present

## 2024-01-14 DIAGNOSIS — Z6827 Body mass index (BMI) 27.0-27.9, adult: Secondary | ICD-10-CM | POA: Diagnosis not present

## 2024-01-14 DIAGNOSIS — M8580 Other specified disorders of bone density and structure, unspecified site: Secondary | ICD-10-CM | POA: Diagnosis not present

## 2024-01-14 DIAGNOSIS — E7800 Pure hypercholesterolemia, unspecified: Secondary | ICD-10-CM | POA: Diagnosis not present

## 2024-01-14 DIAGNOSIS — I10 Essential (primary) hypertension: Secondary | ICD-10-CM | POA: Diagnosis not present

## 2024-01-14 DIAGNOSIS — Z23 Encounter for immunization: Secondary | ICD-10-CM | POA: Diagnosis not present

## 2024-01-14 DIAGNOSIS — G20A1 Parkinson's disease without dyskinesia, without mention of fluctuations: Secondary | ICD-10-CM | POA: Diagnosis not present

## 2024-01-14 DIAGNOSIS — R233 Spontaneous ecchymoses: Secondary | ICD-10-CM | POA: Diagnosis not present

## 2024-01-31 ENCOUNTER — Ambulatory Visit

## 2024-02-04 ENCOUNTER — Ambulatory Visit
Admission: RE | Admit: 2024-02-04 | Discharge: 2024-02-04 | Disposition: A | Discharge status: Home / Self Care | Source: Ambulatory Visit | Attending: Family Medicine | Admitting: Family Medicine

## 2024-02-04 DIAGNOSIS — Z1231 Encounter for screening mammogram for malignant neoplasm of breast: Secondary | ICD-10-CM | POA: Diagnosis not present

## 2024-02-07 DIAGNOSIS — G5602 Carpal tunnel syndrome, left upper limb: Secondary | ICD-10-CM | POA: Diagnosis not present

## 2024-02-07 DIAGNOSIS — M720 Palmar fascial fibromatosis [Dupuytren]: Secondary | ICD-10-CM | POA: Diagnosis not present

## 2024-02-07 DIAGNOSIS — M65332 Trigger finger, left middle finger: Secondary | ICD-10-CM | POA: Diagnosis not present

## 2024-02-10 DIAGNOSIS — M25642 Stiffness of left hand, not elsewhere classified: Secondary | ICD-10-CM | POA: Diagnosis not present

## 2024-02-10 DIAGNOSIS — R29898 Other symptoms and signs involving the musculoskeletal system: Secondary | ICD-10-CM | POA: Diagnosis not present

## 2024-02-10 DIAGNOSIS — M79642 Pain in left hand: Secondary | ICD-10-CM | POA: Diagnosis not present

## 2024-02-13 DIAGNOSIS — G5602 Carpal tunnel syndrome, left upper limb: Secondary | ICD-10-CM | POA: Diagnosis not present

## 2024-02-19 ENCOUNTER — Other Ambulatory Visit: Payer: Self-pay | Admitting: Neurology

## 2024-02-19 DIAGNOSIS — M25642 Stiffness of left hand, not elsewhere classified: Secondary | ICD-10-CM | POA: Diagnosis not present

## 2024-02-19 DIAGNOSIS — R29898 Other symptoms and signs involving the musculoskeletal system: Secondary | ICD-10-CM | POA: Diagnosis not present

## 2024-02-19 DIAGNOSIS — M79642 Pain in left hand: Secondary | ICD-10-CM | POA: Diagnosis not present

## 2024-02-19 DIAGNOSIS — G20A1 Parkinson's disease without dyskinesia, without mention of fluctuations: Secondary | ICD-10-CM

## 2024-02-19 NOTE — Progress Notes (Signed)
 Assessment/Plan:   1.  Parkinsons Disease, diagnosed October, 2022  - carbidopa /levodopa  25/100, 2 at 7am/1 at 11am/1 at 4pm  -she notes just a little wearing off intermittently but she doesn't want to add medication yet  -We discussed that it used to be thought that levodopa  would increase risk of melanoma but now it is believed that Parkinsons itself likely increases risk of melanoma. she is to get regular skin checks.  She does that.  -she is involved with Parkinsons Disease GeneRation study and got the kit but hasn't yet collected the sample.  She is going to call and see if the kit has expiration day  -discussed ST but she reports "not ready" yet  -she is following with dermatology   2.  Cervical dystonia  -mild and not bothersome to patient  3.  Min dysphagia  -we discussed MBE but not ready for it.  4.  HTN with lowering of BP  -on metoprolol and amlodipine.    -discussed concept of permissive HTN.    5.  Nocturia  -discussed urology referral but this is a bit better as she is now able to get back to sleep.  Holding on referral for now  Subjective:   Sharon Campos was seen today in follow up for Parkinsons disease.  My previous records were reviewed prior to todays visit as well as outside records available to me.  Daughter supplements hx. she has had trigger finger release on December 31, 2023.  Notes are reviewed.  Last visit, we increased her levodopa  slightly.  She has done well with that change.  She notes wearing off of levodopa  with s/s of tremor only when she is active or stressed in the day.  This happens about 30 min before dosing.  Noticing some hoarseness in voice.  She is walking and riding the bike for 2 days/week.  She also does chair yoga.  Current prescribed movement disorder medications: Carbidopa /levodopa  25/100, 2/1/1 (increased)    ALLERGIES:   Allergies  Allergen Reactions   Carbamazepine Other (See Comments)   Ceclor [Cefaclor]    Codeine     Darvon [Propoxyphene]    Neosporin [Neomycin-Polymyxin-Gramicidin]     CURRENT MEDICATIONS:  Current Meds  Medication Sig   alendronate (FOSAMAX) 70 MG tablet Take 70 mg by mouth once a week. Take with a full glass of water on an empty stomach.   amLODipine (NORVASC) 10 MG tablet Take 5 mg by mouth daily.   atorvastatin (LIPITOR) 40 MG tablet Take 40 mg by mouth daily.   calcium citrate-vitamin D (CITRACAL+D) 315-200 MG-UNIT tablet Take 1 tablet by mouth 2 (two) times daily.   diclofenac Sodium (VOLTAREN) 1 % GEL Apply topically 4 (four) times daily.   enalapril (VASOTEC) 10 MG tablet Take 10 mg by mouth daily.   Ketotifen Fumarate (REFRESH EYE ITCH RELIEF OP) Apply to eye as needed.   metoprolol tartrate (LOPRESSOR) 25 MG tablet Take 25 mg by mouth 2 (two) times daily.   Multiple Vitamin (MULTIVITAMIN) capsule Take 1 capsule by mouth daily.   naproxen (NAPROSYN) 250 MG tablet Take by mouth as needed.   Omega-3 Fatty Acids (OMEGA-3 FISH OIL) 1200 MG CAPS Take by mouth.   [DISCONTINUED] carbidopa -levodopa  (SINEMET  IR) 25-100 MG tablet TAKE 2 TABLETS AT 7AM, TAKE 1 TABLET AT 11AM, AND TAKE 1 TABLET AT 4PM     Objective:   PHYSICAL EXAMINATION:    VITALS:   Vitals:   02/24/24 0812  BP: 118/74  Pulse: 60  SpO2: 97%  Weight: 155 lb 12.8 oz (70.7 kg)  Height: 5\' 3"  (1.6 m)     GEN:  The patient appears stated age and is in NAD. HEENT:  Normocephalic, atraumatic.  The mucous membranes are moist. The superficial temporal arteries are without ropiness or tenderness.  Neurological examination:  Orientation: The patient is alert and oriented x3. Cranial nerves: There is good facial symmetry without facial hypomimia. The speech is fluent and clear. Soft palate rises symmetrically and there is no tongue deviation. Hearing is intact to conversational tone. Sensation: Sensation is intact to light touch throughout Motor: Strength is at least antigravity x4.  Movement  examination: Tone: There is mild to mod increased tone in the RUE (same as last visit) Abnormal movements: none today Coordination:  There is mild decremation only with hand opening and closing on the R.  All other RAMS including alternating supination and pronation of the forearm, finger taps, heel taps and toe taps. Gait and Station: The patient has no difficulty arising out of a deep-seated chair without the use of the hands. The patient's stride length is slightly decreased with improved arm swing.     Total time spent on today's visit was 20 minutes, including both face-to-face time and nonface-to-face time.  Time included that spent on review of records (prior notes available to me/labs/imaging if pertinent), discussing treatment and goals, answering patient's questions and coordinating care.    Cc:  Perley Bradley, MD

## 2024-02-24 ENCOUNTER — Encounter: Payer: Self-pay | Admitting: Neurology

## 2024-02-24 ENCOUNTER — Ambulatory Visit: Payer: Medicare HMO | Admitting: Neurology

## 2024-02-24 VITALS — BP 118/74 | HR 60 | Ht 63.0 in | Wt 155.8 lb

## 2024-02-24 DIAGNOSIS — G20A1 Parkinson's disease without dyskinesia, without mention of fluctuations: Secondary | ICD-10-CM | POA: Diagnosis not present

## 2024-02-24 MED ORDER — CARBIDOPA-LEVODOPA 25-100 MG PO TABS: ORAL_TABLET | ORAL | 1 refills | Status: DC

## 2024-02-27 DIAGNOSIS — M79642 Pain in left hand: Secondary | ICD-10-CM | POA: Diagnosis not present

## 2024-02-27 DIAGNOSIS — R29898 Other symptoms and signs involving the musculoskeletal system: Secondary | ICD-10-CM | POA: Diagnosis not present

## 2024-02-27 DIAGNOSIS — M25642 Stiffness of left hand, not elsewhere classified: Secondary | ICD-10-CM | POA: Diagnosis not present

## 2024-04-02 DIAGNOSIS — M720 Palmar fascial fibromatosis [Dupuytren]: Secondary | ICD-10-CM | POA: Diagnosis not present

## 2024-04-02 DIAGNOSIS — G5602 Carpal tunnel syndrome, left upper limb: Secondary | ICD-10-CM | POA: Diagnosis not present

## 2024-04-02 DIAGNOSIS — M65332 Trigger finger, left middle finger: Secondary | ICD-10-CM | POA: Diagnosis not present

## 2024-05-14 DIAGNOSIS — H43812 Vitreous degeneration, left eye: Secondary | ICD-10-CM | POA: Diagnosis not present

## 2024-05-14 DIAGNOSIS — H35033 Hypertensive retinopathy, bilateral: Secondary | ICD-10-CM | POA: Diagnosis not present

## 2024-05-14 DIAGNOSIS — H04123 Dry eye syndrome of bilateral lacrimal glands: Secondary | ICD-10-CM | POA: Diagnosis not present

## 2024-05-14 DIAGNOSIS — Z961 Presence of intraocular lens: Secondary | ICD-10-CM | POA: Diagnosis not present

## 2024-05-21 DIAGNOSIS — G5602 Carpal tunnel syndrome, left upper limb: Secondary | ICD-10-CM | POA: Diagnosis not present

## 2024-05-21 DIAGNOSIS — M720 Palmar fascial fibromatosis [Dupuytren]: Secondary | ICD-10-CM | POA: Diagnosis not present

## 2024-06-24 DIAGNOSIS — G5602 Carpal tunnel syndrome, left upper limb: Secondary | ICD-10-CM | POA: Diagnosis not present

## 2024-06-24 DIAGNOSIS — M720 Palmar fascial fibromatosis [Dupuytren]: Secondary | ICD-10-CM | POA: Diagnosis not present

## 2024-07-13 DIAGNOSIS — Z0000 Encounter for general adult medical examination without abnormal findings: Secondary | ICD-10-CM | POA: Diagnosis not present

## 2024-07-13 DIAGNOSIS — Z23 Encounter for immunization: Secondary | ICD-10-CM | POA: Diagnosis not present

## 2024-07-13 DIAGNOSIS — Z1331 Encounter for screening for depression: Secondary | ICD-10-CM | POA: Diagnosis not present

## 2024-07-21 DIAGNOSIS — E7800 Pure hypercholesterolemia, unspecified: Secondary | ICD-10-CM | POA: Diagnosis not present

## 2024-07-21 DIAGNOSIS — E663 Overweight: Secondary | ICD-10-CM | POA: Diagnosis not present

## 2024-07-21 DIAGNOSIS — Z6828 Body mass index (BMI) 28.0-28.9, adult: Secondary | ICD-10-CM | POA: Diagnosis not present

## 2024-07-21 DIAGNOSIS — I471 Supraventricular tachycardia, unspecified: Secondary | ICD-10-CM | POA: Diagnosis not present

## 2024-07-21 DIAGNOSIS — M8580 Other specified disorders of bone density and structure, unspecified site: Secondary | ICD-10-CM | POA: Diagnosis not present

## 2024-07-21 DIAGNOSIS — I10 Essential (primary) hypertension: Secondary | ICD-10-CM | POA: Diagnosis not present

## 2024-07-21 DIAGNOSIS — G20A1 Parkinson's disease without dyskinesia, without mention of fluctuations: Secondary | ICD-10-CM | POA: Diagnosis not present

## 2024-08-28 NOTE — Progress Notes (Signed)
 Assessment/Plan:   1.  Parkinsons Disease, diagnosed October, 2022  - Continue carbidopa /levodopa  25/100, 2 at 7am/1 at 11am/1 at 4pm.  She feels a bit of wearing off but not enough to change meds per pt and I agree  -We discussed that it used to be thought that levodopa  would increase risk of melanoma but now it is believed that Parkinsons itself likely increases risk of melanoma. she is to get regular skin checks.  She does that.  -she is involved with Parkinsons Disease Gene[employer] study and got the kit but hasn't yet collected the sample.  She is going to call and see if the kit has expiration day  -she will get back to exercise when she is not working (seasonal working) in Jan  -she is following with dermatology   2.  Cervical dystonia  -mild and not bothersome to patient  3.  Min dysphagia  -we discussed MBE but not ready for it.  4.  HTN with lowering of BP  -on metoprolol and amlodipine.    -discussed concept of permissive HTN.    5.  Nocturia  -discussed urology referral but this is a bit better as she is now able to get back to sleep.  Holding on referral for now  6.  CTS  -had steroid injection with some relief.  Following with ortho   Subjective:   Sharon Campos was seen today in follow up for Parkinsons disease.  My previous records were reviewed prior to todays visit as well as outside records available to me.  Daughter supplements hx. she has been having no falls.  No lightheadedness or near syncope.  She works seasonally and is working 9 hours on her feet so exercising less right now.  She is hoping to enter a balance class with [employer] 3 days per week in Jan.  She has been following with orthopedic surgery regarding carpal tunnel syndrome and a Dupuytren's contracture on the left.  They actually sent her to neurology through [facility] for an EMG on her hand which confirmed carpal tunnel.  She had a steroid shot which seems to be helping  Current  prescribed movement disorder medications: Carbidopa /levodopa  25/100, 2/1/1     ALLERGIES:   Allergies  Allergen Reactions   Carbamazepine Other (See Comments)   Ceclor [Cefaclor]    Codeine    Darvon [Propoxyphene]    Neosporin [Neomycin-Polymyxin-Gramicidin]     CURRENT MEDICATIONS:  Current Meds  Medication Sig   alendronate (FOSAMAX) 70 MG tablet Take 70 mg by mouth once a week. Take with a full glass of water on an empty stomach.   amLODipine (NORVASC) 10 MG tablet Take 5 mg by mouth daily.   atorvastatin (LIPITOR) 40 MG tablet Take 40 mg by mouth daily.   calcium citrate-vitamin D (CITRACAL+D) 315-200 MG-UNIT tablet Take 1 tablet by mouth 2 (two) times daily.   carbidopa -levodopa  (SINEMET  IR) 25-100 MG tablet TAKE 2 TABLETS AT 7AM, TAKE 1 TABLET AT 11AM, AND TAKE 1 TABLET AT 4PM   diclofenac Sodium (VOLTAREN) 1 % GEL Apply topically 4 (four) times daily.   enalapril (VASOTEC) 10 MG tablet Take 10 mg by mouth daily.   Ketotifen Fumarate (REFRESH EYE ITCH RELIEF OP) Apply to eye as needed.   metoprolol tartrate (LOPRESSOR) 25 MG tablet Take 25 mg by mouth 2 (two) times daily.   Multiple Vitamin (MULTIVITAMIN) capsule Take 1 capsule by mouth daily.   naproxen (NAPROSYN) 250 MG tablet Take by mouth  as needed.   Omega-3 Fatty Acids (OMEGA-3 FISH OIL) 1200 MG CAPS Take by mouth.     Objective:   PHYSICAL EXAMINATION:    VITALS:   Vitals:   09/01/24 0847  BP: 126/84  Pulse: (!) 56  SpO2: 98%  Weight: 156 lb 3.2 oz (70.9 kg)  Height: 5' 3 (1.6 m)      GEN:  The patient appears stated age and is in NAD. HEENT:  Normocephalic, atraumatic.  The mucous membranes are moist. The superficial temporal arteries are without ropiness or tenderness. CV:  brady.  Regular Lungs:  CTAB Neck:  no bruits  Neurological examination:  Orientation: The patient is alert and oriented x3. Cranial nerves: There is good facial symmetry without facial hypomimia. The speech is fluent and  clear. Soft palate rises symmetrically and there is no tongue deviation. Hearing is intact to conversational tone. Sensation: Sensation is intact to light touch throughout Motor: Strength is at least antigravity x4.  Movement examination: Tone: There is mild increased tone in the RUE Abnormal movements: tremor is felt but not seen in the RUE Coordination:  There is mild decremation only with hand opening and closing on the R.  All other RAMS including alternating supination and pronation of the forearm, finger taps, heel taps and toe taps. Gait and Station: The patient has no difficulty arising out of a deep-seated chair without the use of the hands. The patient's stride length is slightly decreased with improved arm swing.     Total time spent on today's visit was 30 minutes, including both face-to-face time and nonface-to-face time.  Time included that spent on review of records (prior notes available to me/labs/imaging if pertinent), discussing treatment and goals, answering patient's questions and coordinating care.    Cc:  Cleotilde Planas, MD

## 2024-09-01 ENCOUNTER — Ambulatory Visit (INDEPENDENT_AMBULATORY_CARE_PROVIDER_SITE_OTHER): Admitting: Neurology

## 2024-09-01 ENCOUNTER — Encounter: Payer: Self-pay | Admitting: Neurology

## 2024-09-01 VITALS — BP 126/84 | HR 56 | Ht 63.0 in | Wt 156.2 lb

## 2024-09-01 DIAGNOSIS — G20A1 Parkinson's disease without dyskinesia, without mention of fluctuations: Secondary | ICD-10-CM

## 2024-09-01 MED ORDER — CARBIDOPA-LEVODOPA 25-100 MG PO TABS: ORAL_TABLET | ORAL | 1 refills | Status: AC

## 2024-09-01 NOTE — Patient Instructions (Signed)
 Here are some resources/books that you may find helpful as you navigate the challenges of Parkinson's Disease  1.  Parkinson's treatement: 10 secrets to a happier life by Jefferey Pica, MD 2.  Navigating Life with Parkinsons disease by Sotirios Parashos 3.  My degeneration: A journey through Parkinsons Ledora Bottcher - Shohl) 4.  Every Victory counts (I believe this one if free through [employer]) 5.  Lucky Man by Gardner Candle 6.  101 Questions & Answers about Parkinson's by Caprice Renshaw 7.  Parkinsons Disease Treatment Book by JE Ahiskog

## 2024-10-30 ENCOUNTER — Encounter: Payer: Self-pay | Admitting: Neurology

## 2025-03-03 ENCOUNTER — Ambulatory Visit: Admitting: Neurology

## 2025-03-04 ENCOUNTER — Ambulatory Visit: Admitting: Neurology

## 2025-03-12 ENCOUNTER — Ambulatory Visit: Admitting: Neurology
